# Patient Record
Sex: Male | Born: 1960
Health system: Southern US, Community
[De-identification: ages and names within clinical notes are randomized; demographics above are authoritative.]

## PROBLEM LIST (undated history)

## (undated) DIAGNOSIS — Z8709 Personal history of other diseases of the respiratory system: Secondary | ICD-10-CM

## (undated) DIAGNOSIS — F329 Major depressive disorder, single episode, unspecified: Secondary | ICD-10-CM

## (undated) DIAGNOSIS — F32A Depression, unspecified: Secondary | ICD-10-CM

## (undated) DIAGNOSIS — K6289 Other specified diseases of anus and rectum: Secondary | ICD-10-CM

## (undated) DIAGNOSIS — G8929 Other chronic pain: Secondary | ICD-10-CM

## (undated) DIAGNOSIS — K5790 Diverticulosis of intestine, part unspecified, without perforation or abscess without bleeding: Secondary | ICD-10-CM

## (undated) DIAGNOSIS — M549 Dorsalgia, unspecified: Secondary | ICD-10-CM

## (undated) DIAGNOSIS — Z8659 Personal history of other mental and behavioral disorders: Secondary | ICD-10-CM

## (undated) HISTORY — DX: Depression, unspecified: F32.A

## (undated) HISTORY — DX: Dorsalgia, unspecified: M54.9

## (undated) HISTORY — DX: Other chronic pain: G89.29

## (undated) HISTORY — PX: CHEST TUBE INSERTION: SHX231

## (undated) HISTORY — PX: INGUINAL HERNIA REPAIR: SUR1180

## (undated) HISTORY — DX: Major depressive disorder, single episode, unspecified: F32.9

## (undated) HISTORY — PX: CYSTECTOMY: SUR359

## (undated) HISTORY — DX: Diverticulosis of intestine, part unspecified, without perforation or abscess without bleeding: K57.90

## (undated) HISTORY — PX: SMALL INTESTINE SURGERY: SHX150

## (undated) HISTORY — DX: Personal history of other mental and behavioral disorders: Z86.59

## (undated) HISTORY — PX: OTHER SURGICAL HISTORY: SHX169

## (undated) HISTORY — DX: Personal history of other diseases of the respiratory system: Z87.09

## (undated) HISTORY — DX: Other specified diseases of anus and rectum: K62.89

---

## 2003-10-26 HISTORY — PX: CHOLECYSTECTOMY: SHX55

## 2004-10-25 DIAGNOSIS — K5790 Diverticulosis of intestine, part unspecified, without perforation or abscess without bleeding: Secondary | ICD-10-CM

## 2004-10-25 HISTORY — DX: Diverticulosis of intestine, part unspecified, without perforation or abscess without bleeding: K57.90

## 2004-10-25 HISTORY — PX: COLON SURGERY: SHX602

## 2011-06-15 ENCOUNTER — Inpatient Hospital Stay (HOSPITAL_COMMUNITY)
Admission: EM | Admit: 2011-06-15 | Discharge: 2011-06-16 | DRG: 301 | Disposition: A | Discharge status: Home / Self Care | Payer: Medicare Other | Attending: Family Medicine | Admitting: Family Medicine

## 2011-06-15 DIAGNOSIS — Z560 Unemployment, unspecified: Secondary | ICD-10-CM

## 2011-06-15 DIAGNOSIS — I824Z9 Acute embolism and thrombosis of unspecified deep veins of unspecified distal lower extremity: Principal | ICD-10-CM | POA: Diagnosis present

## 2011-06-15 DIAGNOSIS — K5730 Diverticulosis of large intestine without perforation or abscess without bleeding: Secondary | ICD-10-CM | POA: Diagnosis present

## 2011-06-15 DIAGNOSIS — M545 Low back pain, unspecified: Secondary | ICD-10-CM | POA: Diagnosis present

## 2011-06-15 DIAGNOSIS — G894 Chronic pain syndrome: Secondary | ICD-10-CM

## 2011-06-15 DIAGNOSIS — G8929 Other chronic pain: Secondary | ICD-10-CM | POA: Diagnosis present

## 2011-06-15 DIAGNOSIS — Z7901 Long term (current) use of anticoagulants: Secondary | ICD-10-CM

## 2011-06-15 LAB — CBC
HCT: 44.3 % (ref 39.0–52.0)
Hemoglobin: 15.6 g/dL (ref 13.0–17.0)
MCH: 31.5 pg (ref 26.0–34.0)
MCHC: 35.2 g/dL (ref 30.0–36.0)
MCV: 89.5 fL (ref 78.0–100.0)
RBC: 4.95 MIL/uL (ref 4.22–5.81)

## 2011-06-15 LAB — DIFFERENTIAL
Basophils Relative: 0 % (ref 0–1)
Lymphocytes Relative: 28 % (ref 12–46)
Lymphs Abs: 2.5 10*3/uL (ref 0.7–4.0)
Monocytes Absolute: 0.8 10*3/uL (ref 0.1–1.0)
Monocytes Relative: 9 % (ref 3–12)
Neutro Abs: 5.4 10*3/uL (ref 1.7–7.7)
Neutrophils Relative %: 60 % (ref 43–77)

## 2011-06-15 LAB — POCT I-STAT, CHEM 8
BUN: 20 mg/dL (ref 6–23)
Chloride: 102 mEq/L (ref 96–112)
Creatinine, Ser: 1.2 mg/dL (ref 0.50–1.35)
Sodium: 139 mEq/L (ref 135–145)
TCO2: 28 mmol/L (ref 0–100)

## 2011-06-15 LAB — PROTIME-INR: Prothrombin Time: 13.4 seconds (ref 11.6–15.2)

## 2011-06-16 DIAGNOSIS — I824Z9 Acute embolism and thrombosis of unspecified deep veins of unspecified distal lower extremity: Secondary | ICD-10-CM

## 2011-06-16 DIAGNOSIS — Z7901 Long term (current) use of anticoagulants: Secondary | ICD-10-CM

## 2011-06-16 LAB — COMPREHENSIVE METABOLIC PANEL
ALT: 26 U/L (ref 0–53)
Albumin: 3.6 g/dL (ref 3.5–5.2)
Alkaline Phosphatase: 65 U/L (ref 39–117)
BUN: 18 mg/dL (ref 6–23)
Chloride: 103 mEq/L (ref 96–112)
GFR calc Af Amer: >60 mL/min (ref >60)
Glucose, Bld: 100 mg/dL — ABNORMAL HIGH (ref 70–99)
Potassium: 4.1 mEq/L (ref 3.5–5.1)
Sodium: 137 mEq/L (ref 135–145)
Total Bilirubin: 0.5 mg/dL (ref 0.3–1.2)
Total Protein: 6.8 g/dL (ref 6.0–8.3)

## 2011-06-16 LAB — CBC
Hemoglobin: 14.9 g/dL (ref 13.0–17.0)
MCH: 31.6 pg (ref 26.0–34.0)
MCV: 89.2 fL (ref 78.0–100.0)
Platelets: 220 10*3/uL (ref 150–400)
RBC: 4.71 MIL/uL (ref 4.22–5.81)
WBC: 6.7 10*3/uL (ref 4.0–10.5)

## 2011-06-17 ENCOUNTER — Ambulatory Visit (INDEPENDENT_AMBULATORY_CARE_PROVIDER_SITE_OTHER): Payer: Medicare Other | Admitting: *Deleted

## 2011-06-17 DIAGNOSIS — Z7901 Long term (current) use of anticoagulants: Secondary | ICD-10-CM

## 2011-06-17 DIAGNOSIS — I824Z9 Acute embolism and thrombosis of unspecified deep veins of unspecified distal lower extremity: Secondary | ICD-10-CM

## 2011-06-18 ENCOUNTER — Ambulatory Visit (INDEPENDENT_AMBULATORY_CARE_PROVIDER_SITE_OTHER): Payer: Medicare Other | Admitting: *Deleted

## 2011-06-18 DIAGNOSIS — I824Z9 Acute embolism and thrombosis of unspecified deep veins of unspecified distal lower extremity: Secondary | ICD-10-CM

## 2011-06-18 DIAGNOSIS — Z7901 Long term (current) use of anticoagulants: Secondary | ICD-10-CM

## 2011-06-21 ENCOUNTER — Ambulatory Visit (INDEPENDENT_AMBULATORY_CARE_PROVIDER_SITE_OTHER): Payer: Medicare Other | Admitting: *Deleted

## 2011-06-21 DIAGNOSIS — Z7901 Long term (current) use of anticoagulants: Secondary | ICD-10-CM

## 2011-06-21 DIAGNOSIS — I824Z9 Acute embolism and thrombosis of unspecified deep veins of unspecified distal lower extremity: Secondary | ICD-10-CM

## 2011-06-23 ENCOUNTER — Ambulatory Visit (INDEPENDENT_AMBULATORY_CARE_PROVIDER_SITE_OTHER): Payer: Medicare Other | Admitting: *Deleted

## 2011-06-23 DIAGNOSIS — Z7901 Long term (current) use of anticoagulants: Secondary | ICD-10-CM

## 2011-06-23 DIAGNOSIS — I824Z9 Acute embolism and thrombosis of unspecified deep veins of unspecified distal lower extremity: Secondary | ICD-10-CM

## 2011-06-23 LAB — POCT INR: INR: 2.4

## 2011-06-24 ENCOUNTER — Ambulatory Visit (INDEPENDENT_AMBULATORY_CARE_PROVIDER_SITE_OTHER): Payer: Medicare Other | Admitting: *Deleted

## 2011-06-24 DIAGNOSIS — Z7901 Long term (current) use of anticoagulants: Secondary | ICD-10-CM

## 2011-06-24 DIAGNOSIS — I824Z9 Acute embolism and thrombosis of unspecified deep veins of unspecified distal lower extremity: Secondary | ICD-10-CM

## 2011-06-24 LAB — POCT INR: INR: 2.2

## 2011-06-25 ENCOUNTER — Ambulatory Visit: Payer: Medicare Other | Admitting: Family Medicine

## 2011-06-25 ENCOUNTER — Emergency Department (HOSPITAL_COMMUNITY): Payer: Medicare Other

## 2011-06-25 ENCOUNTER — Ambulatory Visit: Payer: Medicare Other

## 2011-06-25 ENCOUNTER — Observation Stay (HOSPITAL_COMMUNITY)
Admission: EM | Admit: 2011-06-25 | Discharge: 2011-06-26 | Disposition: A | Discharge status: Home / Self Care | Payer: Medicare Other | Attending: Family Medicine | Admitting: Family Medicine

## 2011-06-25 ENCOUNTER — Telehealth: Payer: Self-pay | Admitting: *Deleted

## 2011-06-25 ENCOUNTER — Encounter: Payer: Self-pay | Admitting: Family Medicine

## 2011-06-25 DIAGNOSIS — R0602 Shortness of breath: Secondary | ICD-10-CM | POA: Insufficient documentation

## 2011-06-25 DIAGNOSIS — R7402 Elevation of levels of lactic acid dehydrogenase (LDH): Secondary | ICD-10-CM | POA: Insufficient documentation

## 2011-06-25 DIAGNOSIS — R0789 Other chest pain: Principal | ICD-10-CM | POA: Insufficient documentation

## 2011-06-25 DIAGNOSIS — R51 Headache: Secondary | ICD-10-CM | POA: Insufficient documentation

## 2011-06-25 DIAGNOSIS — R7401 Elevation of levels of liver transaminase levels: Secondary | ICD-10-CM | POA: Insufficient documentation

## 2011-06-25 DIAGNOSIS — R42 Dizziness and giddiness: Secondary | ICD-10-CM | POA: Insufficient documentation

## 2011-06-25 DIAGNOSIS — Z7901 Long term (current) use of anticoagulants: Secondary | ICD-10-CM | POA: Insufficient documentation

## 2011-06-25 DIAGNOSIS — R61 Generalized hyperhidrosis: Secondary | ICD-10-CM | POA: Insufficient documentation

## 2011-06-25 DIAGNOSIS — E785 Hyperlipidemia, unspecified: Secondary | ICD-10-CM | POA: Insufficient documentation

## 2011-06-25 DIAGNOSIS — Z86718 Personal history of other venous thrombosis and embolism: Secondary | ICD-10-CM | POA: Insufficient documentation

## 2011-06-25 LAB — CBC
Hemoglobin: 16 g/dL (ref 13.0–17.0)
MCH: 31.2 pg (ref 26.0–34.0)
Platelets: 253 10*3/uL (ref 150–400)
Platelets: 235 10*3/uL (ref 150–400)
RBC: 5.04 MIL/uL (ref 4.22–5.81)
RBC: 4.94 MIL/uL (ref 4.22–5.81)
WBC: 6.8 10*3/uL (ref 4.0–10.5)
WBC: 6.7 10*3/uL (ref 4.0–10.5)

## 2011-06-25 LAB — BASIC METABOLIC PANEL
Chloride: 101 mEq/L (ref 96–112)
GFR calc Af Amer: >60 mL/min (ref >60)
GFR calc non Af Amer: >60 mL/min (ref >60)
Potassium: 3.8 mEq/L (ref 3.5–5.1)
Sodium: 136 mEq/L (ref 135–145)

## 2011-06-25 LAB — TROPONIN I: Troponin I: <0.3 ng/mL (ref <0.30)

## 2011-06-25 LAB — POCT I-STAT TROPONIN I

## 2011-06-25 LAB — DIFFERENTIAL
Basophils Relative: 0 % (ref 0–1)
Eosinophils Absolute: 0.1 10*3/uL (ref 0.0–0.7)
Neutro Abs: 3.3 10*3/uL (ref 1.7–7.7)
Neutrophils Relative %: 48 % (ref 43–77)

## 2011-06-25 LAB — PROTIME-INR
INR: 1.72 — ABNORMAL HIGH (ref 0.00–1.49)
Prothrombin Time: 20.5 seconds — ABNORMAL HIGH (ref 11.6–15.2)

## 2011-06-25 LAB — PRO B NATRIURETIC PEPTIDE: Pro B Natriuretic peptide (BNP): 98.8 pg/mL (ref 0–125)

## 2011-06-25 MED ORDER — IOHEXOL 300 MG/ML  SOLN
100.0000 mL | Freq: Once | INTRAMUSCULAR | Status: AC | PRN
  Administered 2011-06-25: 100 mL via INTRAVENOUS

## 2011-06-25 NOTE — H&P (Signed)
Family Medicine Teaching [facility] Admission History and Physical  Patient name: Zachary [name] Medical record number: [id number] Date of birth: 1961-09-29 Age: 50 y.o. Gender: male  Primary Care Provider: Shelly Flatten, MD, MD  Chief Complaint: Chest Pain History of Present Illness: Zachary [name] is a 50 y.o. year old male who was recently hospitalized with DVT of the R leg and now is presenting with chest pain.  Patient states that he began having chest pain last night.  Patient was not doing anything when pain came on and he describes it as substernal pain and pressure.  He did have diaphoresis associated with this as well as some mild lightheadedness.  He then had another episode of the same type of pain this morning that woke him from his sleep.  He denies shortness of breath, palpitations, nausea, or radiation of pain.  Pain is not worsened by activity and does not change with rest.  He denies chest trauma, or any strenuous work or cough recently.  The only thing that makes the pain worse is deep inspiration.    In the ED he received a CTA which was negative for pulmonary embolism.  He was placed in the CDU to be ruled out on their chest pain protocol, but the PA soon realized that this is not done on the weekend we were asked to admit.   Patient Active Problem List  Diagnoses  . Encounter for long-term (current) use of anticoagulants  . Deep vein thrombosis of lower leg   Past Medical History: Past Medical History  Diagnosis Date  . Right leg DVT august 2012  . Chronic back pain   . History of depression   . Diverticulosis   . History of pneumothorax     Past Surgical History: Past Surgical History  Procedure Date  . Inguinal hernia repair     x2  . Spinal steroid injections   . Chest tube insertion     for pneumothorax  . Cholecystectomy     Social History: History   Social History  . Marital Status: Married    Spouse Name: N/A    Number of Children:  N/A  . Years of Education: N/A   Social History Main Topics  . Smoking status: Not on file  . Smokeless tobacco: Not on file  . Alcohol Use: Not on file  . Drug Use: Not on file  . Sexually Active: Not on file   Other Topics Concern  . Not on file   Social History Narrative   Married with 3 children ages 82, 46 and 75.  Originally from [place].  On disability 2/2 to his chronic back pain. Denies tobacco use.  Drinks 2-3 glasses of wine per day.  Denies illicit drugs.    Family History: Family History  Problem Relation Age of Onset  . Stroke Father   . Heart attack Mother     Allergies: No Known Allergies   Current Outpatient Prescriptions on File Prior to Visit  Medication Sig Dispense Refill  . warfarin (COUMADIN) 5 MG tablet Take 5 mg by mouth daily.        -Saw Palmetto -Fish oil -Tums prn  Review Of Systems: Per HPI   Physical Exam: Pulse: 61  Blood Pressure: 103/64 RR: 18   O2: 96% on RA Temp: 96.8  General: alert, cooperative and no distress HEENT: PERRLA, extra ocular movement intact, sclera clear, anicteric and oropharynx clear, no lesions Heart: S1, S2 normal, no murmur, rub or gallop,  regular rate and rhythm.  Palpation of lower left ribs recreates his chest pain. Lungs: clear to auscultation, no wheezes or rales and unlabored breathing Abdomen: abdomen is soft without significant tenderness, masses, organomegaly or guarding Extremities: extremities normal, atraumatic, no cyanosis or edema Skin:no rashes Neurology: normal without focal findings and mental status, speech normal, alert and oriented x3  Labs and Imaging: Lab Results  Component Value Date/Time   NA 136 06/25/2011 12:08 PM   K 3.8 06/25/2011 12:08 PM   CL 101 06/25/2011 12:08 PM   CO2 26 06/25/2011 12:08 PM   BUN 20 06/25/2011 12:08 PM   CREATININE 0.96 06/25/2011 12:08 PM   GLUCOSE 101* 06/25/2011 12:08 PM   Lab Results  Component Value Date   WBC 6.7 06/25/2011   HGB 15.4  06/25/2011   HCT 43.4 06/25/2011   MCV 87.9 06/25/2011   PLT 235 06/25/2011   CXR:  Negative CTA:  Negative for pulmonary embolism  INR: 1.72 BNP: 98.8 POC Trop: Negative EKG: NSR, no ST-T wave changes.  Assessment and Plan: Zachary [name] is a 50 y.o. year old male presenting with chest pain 1. Chest pain:  Given recent DVT, initial consideration was given to pulmonary embolism, although his O2 sats are normal and CTA does not show pulmonary embolism.  Chest pain is very atypical in nature, but given that it did wake him from sleep and he experienced diaphoresis with this will admit and rule out for MI.  Patient does not have any known risk factors.  EKG normal.  Will repeat in the am.  Will also cycle cardiac enzymes and risk stratify with HgbA1C, FLP, and TSH.  If her rules out, likely cause is musculoskeletal given reproducible nature with palpation. 2. DVT:  Currently on coumadin for recent DVT.  Has been having INR followed at [facility].  Currently sub-therapeutic at 1.72.  Will continue coumadin here per pharmacy and continue INR checks outpatient once discharged. 2. FEN/GI: SLIV, Low sodium heart healthy 3. Prophylaxis: Coumadin 4. Disposition: Pending rule out for MI

## 2011-06-25 NOTE — Telephone Encounter (Signed)
Call from patient stating he has had chest pain for 3 days. Last night had episode of sweating with the pain. This AM has dizziness , chest aching and nausea. Advised patient to go to ED now. Advised to call EMS to transport now. He has appointment with Dr. Hulen Luster this afternoon . Told him not to wait, but to go to ED now. He voices understanding

## 2011-06-26 ENCOUNTER — Encounter: Payer: Self-pay | Admitting: Family Medicine

## 2011-06-26 DIAGNOSIS — R0789 Other chest pain: Secondary | ICD-10-CM

## 2011-06-26 LAB — COMPREHENSIVE METABOLIC PANEL
AST: 104 U/L — ABNORMAL HIGH (ref 0–37)
Alkaline Phosphatase: 64 U/L (ref 39–117)
BUN: 21 mg/dL (ref 6–23)
CO2: 24 mEq/L (ref 19–32)
Chloride: 103 mEq/L (ref 96–112)
Creatinine, Ser: 1.12 mg/dL (ref 0.50–1.35)
GFR calc non Af Amer: >60 mL/min (ref >60)
Potassium: 4.1 mEq/L (ref 3.5–5.1)
Total Bilirubin: 0.2 mg/dL — ABNORMAL LOW (ref 0.3–1.2)

## 2011-06-26 LAB — HEMOGLOBIN A1C
Hgb A1c MFr Bld: 5.7 % — ABNORMAL HIGH (ref <5.7)
Mean Plasma Glucose: 117 mg/dL — ABNORMAL HIGH (ref <117)

## 2011-06-26 LAB — CBC
HCT: 43.4 % (ref 39.0–52.0)
MCH: 31 pg (ref 26.0–34.0)
MCV: 88 fL (ref 78.0–100.0)
Platelets: 243 10*3/uL (ref 150–400)
RDW: 12.7 % (ref 11.5–15.5)

## 2011-06-26 LAB — LIPID PANEL
Cholesterol: 239 mg/dL — ABNORMAL HIGH (ref 0–200)
HDL: 33 mg/dL — ABNORMAL LOW (ref >39)
Total CHOL/HDL Ratio: 7.2 RATIO

## 2011-06-26 LAB — CARDIAC PANEL(CRET KIN+CKTOT+MB+TROPI)
Relative Index: INVALID (ref 0.0–2.5)
Relative Index: INVALID (ref 0.0–2.5)
Troponin I: <0.3 ng/mL (ref <0.30)

## 2011-06-26 LAB — PROTIME-INR: INR: 2.04 — ABNORMAL HIGH (ref 0.00–1.49)

## 2011-06-29 ENCOUNTER — Ambulatory Visit (INDEPENDENT_AMBULATORY_CARE_PROVIDER_SITE_OTHER): Payer: Medicare Other | Admitting: *Deleted

## 2011-06-29 DIAGNOSIS — I824Z9 Acute embolism and thrombosis of unspecified deep veins of unspecified distal lower extremity: Secondary | ICD-10-CM

## 2011-06-29 DIAGNOSIS — Z7901 Long term (current) use of anticoagulants: Secondary | ICD-10-CM

## 2011-06-29 LAB — POCT INR: INR: 2.8

## 2011-06-30 NOTE — H&P (Signed)
[name], [name]           ACCOUNT NO.:  [id number]  MEDICAL RECORD NO.:  [id number]  LOCATION:  2018                         FACILITY:  [facility]  PHYSICIAN:  Zachary Roach Markeis Allman, M.D.DATE OF BIRTH:  Mar 11, 1961  DATE OF ADMISSION:  06/15/2011 DATE OF DISCHARGE:                             HISTORY & PHYSICAL   PRIMARY CARE PHYSICIAN:  Unassigned.  CHIEF COMPLAINT:  Right calf pain.  HISTORY OF PRESENT ILLNESS:  The patient is a 50 year old male originally from [place], but recently from [place] who presented with right calf cramping and pain for 2 days.  The patient states the pain is being worsening and that is aggravated with certain positions, was walking and was bearing weight.  He has not tried anything for the pain.  Pain was so severe today that the patient went to urgent care where they sent him to [facility] for an ultrasound.  He is found to have a DVT and told to come to the ER.  The patient has not had any recent travel, but has been spending hours at a time at fle[employer] markets. He also has some immobility secondary to chronic back pain.  No previous history of DVTs.  The patient denies shortness of breath.  REVIEW OF SYSTEMS:  Chest pain described as sharp, questionably the patient has a language barrier and found hard to find right word.  Chest pain was not associated with exertion.  Otherwise, negative other than HPI.  ED COURSE:  CBC and BMET within normal limits.  The patient given Percocet 5/325 mg for pain, Lovenox 90 mg subcu and Coumadin 10 mg p.o.  ALLERGIES:  No known drug allergies.  MEDICATIONS: 1. Saw Palmetto. 2. Calcium carbonate. 3. Fish oil.  PAST MEDICAL HISTORY: 1. Chronic back pain. 2. Inguinal hernias. 3. Cholecystitis. 4. Diverticulosis. 5. Questionable history of depression.  HOSPITALIZATION/SURGERIES:  In 2002, bilateral hernia repair with mesh; 2005, cholecystectomy; 2006, hospitalized for diverticulitis.  FAMILY  HISTORY:  Parents are deceased.  Father deceased from CVA. Mother deceased from MI.  SOCIAL HISTORY:  The patient is married and has 3 children ages 5, 67, and 67 years old.  He does not work due to his chronic back pain and is on disability.  Tobacco use never.  Alcohol, 2-3 glasses of wine per day.  Drugs none.  PHYSICAL EXAMINATION:  VITAL SIGNS:  Temperature 98.3, pulse 56, respiratory rate 18, blood pressure 100/63, oxygen 100% on room air. GENERAL:  Pleasant, talkative, in no apparent distress. CARDIOVASCULAR:  Regular rate and rhythm.  No murmurs, rubs, or gallops, 2+ radial pulses. PULMONARY:  Clear to auscultation bilaterally.  No wheezing, rales, or rhonchi. EXTREMITIES:  Lower extremity, no obvious swelling.  Right leg is colder to touch than the left leg.  Right leg, moderately tender to palpation along anterior tibial plateau and lower popliteal fossa.  A 2+ DP and popliteal pulses bilaterally.  ASSESSMENT AND PLAN:  A 50 year old male with chronic back pain who presents with right lower extremity DVT. 1. Deep vein thrombosis.  The patient presented with pain and     decreased temperature in right leg and has some risk factors for     DVT namely being immobility secondary  to back pain and recent long     hours being [place] [employer].  DVT was diagnosed with an ultrasound     at [facility].  We will treat Lovenox 90 mg subcu and bridge the     patient with Coumadin to goal INR of 2-3.  The patient will need     anticoagulation with Coumadin at least 3-6 months to prevent recurrence.     He will also need to follow up with some one regarding his INR     check.  We happily do that for this patient and we will decide that     before discharge.  Cause of this patient's DVT is likely his     decreased immobility.  Workup for other causes is not necessary at     this time. 2. Chronic back pain.  The patient states that he has had "issues with     his back for years."  The  patient does not take any medications for     his back pain and tries to just deal with it.  He has received one     cortical steroid injection in the past.  The patient should have     outpatient follow up for this.  One thing to consider is if his     chronic back pain leading to chronic immobility, we will set him up     to have chronic risk factor for DVT in future. 3. Fluids, electrolytes, nutrition/gastrointestinal.  Saline lock IV. 4. Disposition.  Having improvement of right leg pain and instruction     on how to inject Lovenox.  He is a candidate for outpatient DVT     treatment with Lovenox and Coumadin barring developing shortness of     breath or renal impairment.  He will be set up with [facility] for monitoring of his INR.    ______________________________ Zachary Coombe, MD   ______________________________ Zachary [name], M.D.    CM/MEDQ  D:  06/15/2011  T:  06/16/2011  Job:  [id number]  Electronically Signed by Zachary Coombe MD on 06/17/2011 03:11:41 PM Electronically Signed by Zachary [name] M.D. on 06/30/2011 08:08:20 AM

## 2011-06-30 NOTE — Discharge Summary (Signed)
  NAMESAYED, APOSTOL           ACCOUNT NO.:  [id number]  MEDICAL RECORD NO.:  [id number]  LOCATION:  2018                         FACILITY:  [facility]  PHYSICIAN:  Leighton Roach Knoxx Boeding, M.D.DATE OF BIRTH:  10-16-1961  DATE OF ADMISSION:  06/15/2011 DATE OF DISCHARGE:  06/16/2011                              DISCHARGE SUMMARY   PRIMARY CARE PROVIDER:  Shelly Flatten, MD  REASON FOR ADMISSION:  Right leg DVT.  DISCHARGE DIAGNOSIS:  Primary right leg DVT.  DISCHARGE MEDICATIONS: 1. Lovenox 100 mg per mL injections 90 mg subcu b.i.d. 2. Percocet 5/325 one to two tablets by mouth every 6 hours as needed     for p.r.n. pain. 3. Warfarin 5 mg 2 tablets by mouth daily with followup and changing     of dose per [facility]. 4. Calcium bicarb vitamin 1 tablet by mouth twice daily. 5. Fish oil 1 capsule by mouth twice daily.  HOSPITAL COURSE:    DVT: Mr. Ferrall was admitted to our service after being sent to the ED from [facility] with an ultrasound confirmed diagnosis of right lower extremity DVT.  The patient had experienced right leg pain prior to admission without shortness of breath, chest pain, or tachycardia. The patient has a history of chronic lower back pain that limits his activity, but he does not lead a sedentary lifestyle.  The patient's risk factors were closely examined due to this being felt as a possible unprovoked DVT.  The patient was started on Lovenox and warfarin 10 mg. The patient did well overnight with some decrease in pain by the following morning.  The patient was discharged on June 16, 2011, after receiving education on PEs, DVTs, warfarin, and Lovenox injections; and with close daily followup scheduled for INR checks and a hospital followup.  PATIENT CONDITION AT THE TIME OF DISCHARGE:  Good.  DISPOSITION:  Home.  [facility], 24, and 27, 2012, with further appointments  to be determined at a hospital followup on July 02, 2011, with Dr. Shelly Flatten.  FOLLOWUP ISSUES:  INR checks and plus/minus unprovoked DVT.    ______________________________ Shelly Flatten, MD   ______________________________ Leighton Roach Lasheba Stevens, M.D.    DM/MEDQ  D:  06/16/2011  T:  06/17/2011  Job:  [id number]  Electronically Signed by Shelly Flatten MD on 06/22/2011 08:57:22 PM Electronically Signed by Acquanetta Belling M.D. on 06/30/2011 08:08:14 AM

## 2011-07-02 ENCOUNTER — Inpatient Hospital Stay: Payer: Medicare Other | Admitting: Family Medicine

## 2011-07-02 ENCOUNTER — Ambulatory Visit (INDEPENDENT_AMBULATORY_CARE_PROVIDER_SITE_OTHER): Payer: Medicare Other | Admitting: *Deleted

## 2011-07-02 ENCOUNTER — Ambulatory Visit (INDEPENDENT_AMBULATORY_CARE_PROVIDER_SITE_OTHER): Payer: Medicare Other | Admitting: Family Medicine

## 2011-07-02 VITALS — BP 117/78 | HR 71 | Wt 200.0 lb

## 2011-07-02 DIAGNOSIS — E785 Hyperlipidemia, unspecified: Secondary | ICD-10-CM

## 2011-07-02 DIAGNOSIS — I824Z9 Acute embolism and thrombosis of unspecified deep veins of unspecified distal lower extremity: Secondary | ICD-10-CM

## 2011-07-02 DIAGNOSIS — R748 Abnormal levels of other serum enzymes: Secondary | ICD-10-CM | POA: Insufficient documentation

## 2011-07-02 DIAGNOSIS — R7402 Elevation of levels of lactic acid dehydrogenase (LDH): Secondary | ICD-10-CM

## 2011-07-02 DIAGNOSIS — R7401 Elevation of levels of liver transaminase levels: Secondary | ICD-10-CM

## 2011-07-02 DIAGNOSIS — Z7901 Long term (current) use of anticoagulants: Secondary | ICD-10-CM

## 2011-07-02 MED ORDER — WARFARIN SODIUM 5 MG PO TABS: 5.0000 mg | ORAL_TABLET | Freq: Every day | ORAL | Status: DC

## 2011-07-02 NOTE — Patient Instructions (Signed)
Thank you for coming in today. We will continue your warfarin please come back on Thursday for recheck. We will recheck her cholesterol and your liver labs when he see Dr. Margot Ables in 2-3 weeks. Please schedule an appointment with her regular doctor for that date. You can continue all the supplements that were reviewed.  Take care and we will see you soon.

## 2011-07-02 NOTE — Progress Notes (Signed)
Zachary [name] presents to clinic today to followup his recent hospitalization.  He's been in the hospital twice for the last month.  First for a DVT of the right lower extremity and second for a chest pain episode.  He currently feels well and denies any current chest pain. He will occasionally have a sharp nonradiating pain in his left chest upon deep inspiration still, however it's much improved.  He denies any crushing pain or pain that is worse with exertion.  He also denies dyspnea on exertion.  He had several issues that were important to followup on this hospitalization.  #1: Anticoagulation currently taking warfarin 5 mg alternating with 7.5 mg every day. His INR trend is  Lab Results  Component Value Date   INR 2.6 07/02/2011   INR 2.8 06/29/2011   INR 2.04* 06/26/2011   he is no longer taking Lovenox  #2: Elevated liver enzymes: His AST was in 200s and his ALT was 100.  He denies any upper right quadrant pain jaundice or fatty stool. He denies any excessive alcohol intake and doesn't know if he has hepatitis.  #3: Elevated cholesterol: He had a lipid panel obtained in the hospitalization which showed total cholesterol into 230s and triglycerides of 400s and unable to calculate LDL.  He currently takes omega-3 fatty acid 2000 mg a day. He in the past he was on Lipitor but had elevated liver enzymes on this medication and was discontinued.    PMH reviewed.  ROS as above otherwise neg Medications were reviewed  Exam:  BP 117/78  Pulse 71  Wt 200 lb (90.719 kg) Gen: Well NAD HEENT: EOMI,  MMM Lungs: CTABL Nl WOB Heart: RRR no MRG Abd: NABS, NT, ND Exts: Non edematous BL  LE, warm and well perfused.

## 2011-07-02 NOTE — Assessment & Plan Note (Signed)
Lab Results  Component Value Date   CHOL 239* 06/26/2011   Lab Results  Component Value Date   HDL 33* 06/26/2011   Lab Results  Component Value Date   LDLCALC UNABLE TO CALCULATE IF TRIGLYCERIDE OVER 400 mg/dL 01/29/9628   Lab Results  Component Value Date   TRIG 463* 06/26/2011   Lab Results  Component Value Date   CHOLHDL 7.2 06/26/2011   No results found for this basename: LDLDIRECT    Lipids noted above.  I'm not sure if this was a truly fasting lab is elevated triglycerides are unlikely to be that high fasting. Plan to repeat this lab checked with his next PCP visit while truly fasting.  If continues to be elevated would recommend obtaining a direct LDL and starting patient on a low potency statin such as pravastatin.  I also recommended increasing his omega-3 fatty acid to a therapeutic dose for hypertriglyceridemia.  He will followup with his PCP for this issue.

## 2011-07-02 NOTE — Assessment & Plan Note (Signed)
Doing well currently his INR is at goal. Plan to continue 5 alternating with 7.5.  We'll followup with a repeat INR checked on Thursday. Patient expresses understanding

## 2011-07-02 NOTE — Assessment & Plan Note (Signed)
Lab Results  Component Value Date   ALT 237* 06/26/2011   AST 104* 06/26/2011   ALKPHOS 64 06/26/2011   BILITOT 0.2* 06/26/2011   I am not sure why he has elevated liver enzymes.  Could be due to fatty infiltrate versus chronic hepatitis versus some other factors such as hemochromatosis.   Plan to repeat this lab check when he follows up with his PCP in 2-3 weeks.  If still elevated I recommend pursuing workup for the above etiologies with hepatitis panel and possibly abdominal ultrasound and possible hemachromatosis workup.

## 2011-07-08 ENCOUNTER — Ambulatory Visit (INDEPENDENT_AMBULATORY_CARE_PROVIDER_SITE_OTHER): Payer: Medicare Other | Admitting: *Deleted

## 2011-07-08 DIAGNOSIS — I824Z9 Acute embolism and thrombosis of unspecified deep veins of unspecified distal lower extremity: Secondary | ICD-10-CM

## 2011-07-08 DIAGNOSIS — Z7901 Long term (current) use of anticoagulants: Secondary | ICD-10-CM

## 2011-07-15 ENCOUNTER — Ambulatory Visit (INDEPENDENT_AMBULATORY_CARE_PROVIDER_SITE_OTHER): Payer: Medicare Other | Admitting: *Deleted

## 2011-07-15 DIAGNOSIS — I824Z9 Acute embolism and thrombosis of unspecified deep veins of unspecified distal lower extremity: Secondary | ICD-10-CM

## 2011-07-15 DIAGNOSIS — Z7901 Long term (current) use of anticoagulants: Secondary | ICD-10-CM

## 2011-07-15 LAB — POCT INR: INR: 1.6

## 2011-07-20 NOTE — Discharge Summary (Signed)
NAMEELIASAR, HLAVATY NO.:  [id number]  MEDICAL RECORD NO.:  [id number]  LOCATION:  2021                         FACILITY:  [facility]  PHYSICIAN:  Pearlean Brownie, M.D.DATE OF BIRTH:  [birth year]  DATE OF ADMISSION:  06/25/2011 DATE OF DISCHARGE:  06/26/2011                              DISCHARGE SUMMARY   DISCHARGE DIAGNOSES: 1. Chest pain, noncardiac. 2. Hyperlipidemia. 3. Deep venous thrombosis. 4. Transaminitis.  DISCHARGE MEDICATIONS: 1. Calcium carbonate/vitamin D supplement one tablet p.o. b.i.d. 2. Fish oil over-the-counter one tablet p.o. b.i.d. 3. Warfarin 5 mg one tablet p.o. Mondays and Fridays, Saturdays and     Sundays and 1.5 tablets Mondays, Tuesdays, Wednesdays, Thursdays.  PERTINENT LAB VALUES:  On June 25, 2011; CBC with differential was unremarkable.  On June 25, 2011; cardiac panel was unremarkable at 12:30 p.m., a second panel at 1900 was also unremarkable, third panel on June 26, 2011 at 3:46 a.m. unremarkable, and a fourth panel on June 26, 2011 at 9 a.m. also unremarkable.  On June 26, 2011; lipid profile; total cholesterol 239, triglycerides 463, HDL 33, LDL unable to calculate due to elevated triglycerides, this is not a fasting panel.  On June 26, 2011; complete metabolic panel; sodium 138, potassium 4.1, chloride 103, CO2 24, glucose 99, BUN 21, creatinine 1.12.  Total bilirubin 0.2, alk phos 63, AST 104, ALT 237, total protein 6.8, albumin 3.7, calcium 9.1.  TSH was 3.323.  RADIOLOGY:  On June 25, 2011; chest x-ray two-view showed no evidence of acute cardiopulmonary disease and a CT angiogram of the chest showed no evidence of pulmonary embolus and no acute finding.  BRIEF HOSPITAL COURSE:  Mr. Cordell is a 50 year old man recently admitted to the [facility] Medicine Service with DVT.  He presented back to the hospital on June 25, 2011 with chest pain, it was atypical pain, but he was admitted for  rule out chest pain. 1. Rule out acute coronary syndrome.  The patient had four sets of     cardiac enzymes that were negative.  His EKG was within normal     limits.  The patient also had a CTA of the chest showing no     pulmonary embolus. 2. Deep venous thrombosis.  The patient has been continued on the     warfarin he was started on for his prior deep venous thrombosis.     He was therapeutic, INR above 2 prior to discharge. 3. Hyperlipidemia.  The patient found to have elevated cholesterol in     the hospital; however, the panel obtained was not a fasting lipid     panel; therefore, obscured.  We would recommend that the patient     follow up at [facility] with Dr. Margot Ables next week     and obtain a fasting panel to follow up. 4. Elevated LFTs.  The patient had mildly elevated liver function     tests.  The patient does report problems with liver in the past     when he took a statin; however, he has not had this recently.  He     denies being a alcohol drinker or any exposure to hepatitis.  He do  not currently have an abdominal pain and we will ask Dr. Margot Ables to     follow up the patient's liver function as an outpatient.  FOLLOWUP ISSUES AND RECOMMENDATIONS:  The patient is to call on Tuesday when office opens to schedule an appointment with Dr. Margot Ables if possible.  We would ask the physician seeing him to follow up his chest pain, his INR, his hyperlipidemia and his liver function tests.  The patient was discharged home in stable medical condition.    ______________________________ Ardyth Gal, MD   ______________________________ Pearlean Brownie, M.D.    CR/MEDQ  D:  06/26/2011  T:  06/26/2011  Job:  [id number]  Electronically Signed by Ardyth Gal MD on 07/12/2011 09:05:37 AM Electronically Signed by Pearlean Brownie M.D. on 07/20/2011 11:06:44 AM

## 2011-07-20 NOTE — H&P (Signed)
Zachary [name] NO.:  [id number]  MEDICAL RECORD NO.:  [id number]  LOCATION:  2021                         FACILITY:  [facility]  PHYSICIAN:  Pearlean Brownie, M.D.DATE OF BIRTH:  06/19/1961  DATE OF ADMISSION:  06/25/2011 DATE OF DISCHARGE:                             HISTORY & PHYSICAL   PRIMARY CARE PROVIDER:  Shelly Flatten, MD at the [facility].  CHIEF COMPLAINT:  Chest pain.  HISTORY OF PRESENT ILLNESS:  Zachary [name] is a 50 year old male who was recently hospitalized with DVT of the right leg and is now presenting with chest pain.  The patient states that he began to having chest pain last night.  The patient was not doing anything when the pain came on and he described it as substernal pain and pressure.  He did have diaphoresis associated with this as well and some mild lightheadedness.  He then had an episode at the same type this morning that woke him from the sleep.  He denies shortness of breath, palpitations, nausea or radiation of this pain.  Pain is not worsened by activity and does not change with rest.  He denies chest trauma or any strenuous work or cough recently.  The only thing that makes the pain worse is deep inspiration.  In the ED, he received a CTA, which was negative for pulmonary embolism.  He was placed in the CDU to be ruled out on the chest pain protocol, but the PA in the ED soon realized that this is not over the weekend.  We were asked to admit for chest pain, ruled out.  PAST MEDICAL HISTORY: 1. Right leg DVT diagnosed in August 2012. 2. Chronic back pain. 3. History of depression. 4. Diverticulosis. 5. History of pneumothorax.  PAST SURGICAL HISTORY: 1. Inguinal hernia repair x2. 2. Spinal steroid injections. 3. Chest tube insertion for pneumothorax. 4. Cholecystectomy.  SOCIAL HISTORY:  He is married with three children ages 48, 104, and 17. He is originally from [place].  He is currently on disability secondary to his chronic back pain.  He denies tobacco use. He drinks 2-3 glasses of wine per day.  He denies illicit drug use.  FAMILY HISTORY:  Father with stroke and mother with [name] attack.  ALLERGIES:  No known drug allergies.  CURRENT MEDICATIONS: 1. Coumadin 5 mg p.o. daily. 2. Saw palmetto. 3. Fish oil. 4. Tums p.r.n.  REVIEW OF SYSTEMS:  Per HPI.  PHYSICAL EXAMINATION:  VITAL SIGNS:  [name] rate 61, blood pressure 103/64, respiration rate 18, O2 saturation 96% on room air, and temperature 96.8. GENERAL:  Alert, cooperative and in no distress. HEENT:  Pupils are equal, round, and reactive to light.  Extraocular movements are intact.  Sclerae are clear and anicteric with clear oropharynx and without lesions. [name]:  S1-S2 normal.  No murmur, rub, or gallop.  He has regular rate and rhythm.  Palpation on the lower ribs recreates his chest pain. LUNGS:  Clear to auscultation.  No wheezes or rales and unlabored breathing. ABDOMEN:  Soft without significant tenderness, masses, organomegaly, or guarding. EXTREMITIES:  Normal, atraumatic, without cyanosis, or edema. SKIN:  Without rashes. NEUROLOGY:  Normal without focal  findings.  Mental status; speech normal.  Alert and oriented x3.  LABS AND IMAGING:  Sodium 136, potassium 3.8, chloride 101, bicarb 26, BUN 20, creatinine 0.96, glucose 101.  WBCs 6.7, hemoglobin 15.4, hematocrit 43.4, and platelets 235.  Chest x-ray was negative.  CTA was negative for pulmonary embolism.  INR was 1.72.  BNP was 98.8.  Point-of- care troponins were negative.  EKG with normal sinus rhythm with no ST or T wave changes.  ASSESSMENT AND PLAN:  Zachary [name] is a 50 year old male presenting with chest pain. 1. Chest pain.  Given recent deep venous thrombosis, initial     consideration was given to pulmonary embolism, although his oxygen     saturation is normal and CT does not show pulmonary  embolism.     Chest pain is very atypical in nature, but given that it wake him     sleep and he experienced diaphoresis, with these, we will admit to     rule out an myocardial infarction.  The patient has not had any     known risk factors.  EKG was normal.  We will repeat this in the     morning.  We will also cycle cardiac enzymes and risk stratify of     hemoglobin A1c, fasting lipid panel, and TSH.  If he rules out,     likely causes of his chest pain is musculoskeletal given the     reproducible nature with palpation. 2. Deep venous thrombosis, currently on Coumadin for recent deep     venous thrombosis.  Has been having INR followed at the [facility], currently subtherapeutic at 1.72.  We will     continue Coumadin here per pharmacy and continue INR checks     outpatient once discharge. 3. Fluids, electrolytes, nutrition/gastrointestinal.  Saline lock IV.     Low-sodium, [name]-healthy diet. 4. Deep venous thrombosis prophylaxis, Coumadin. 5. Disposition.  Pending to rule out for myocardial infarction.   ______________________________ Everrett Coombe, MD   ______________________________ Pearlean Brownie, M.D.   CM/MEDQ  D:  06/26/2011  T:  06/26/2011  Job:  [id number]  Electronically Signed by Everrett Coombe MD on 07/13/2011 03:13:30 PM Electronically Signed by Pearlean Brownie M.D. on 07/20/2011 11:06:50 AM

## 2011-07-22 ENCOUNTER — Ambulatory Visit (INDEPENDENT_AMBULATORY_CARE_PROVIDER_SITE_OTHER): Payer: Medicare Other | Admitting: *Deleted

## 2011-07-22 DIAGNOSIS — I824Z9 Acute embolism and thrombosis of unspecified deep veins of unspecified distal lower extremity: Secondary | ICD-10-CM

## 2011-07-22 DIAGNOSIS — Z7901 Long term (current) use of anticoagulants: Secondary | ICD-10-CM

## 2011-07-26 ENCOUNTER — Encounter: Payer: Self-pay | Admitting: Family Medicine

## 2011-07-26 ENCOUNTER — Ambulatory Visit (INDEPENDENT_AMBULATORY_CARE_PROVIDER_SITE_OTHER): Payer: Medicare Other | Admitting: *Deleted

## 2011-07-26 ENCOUNTER — Ambulatory Visit (INDEPENDENT_AMBULATORY_CARE_PROVIDER_SITE_OTHER): Payer: Medicare Other | Admitting: Family Medicine

## 2011-07-26 VITALS — BP 108/67 | HR 65 | Temp 98.2°F | Wt 201.5 lb

## 2011-07-26 DIAGNOSIS — R0789 Other chest pain: Secondary | ICD-10-CM

## 2011-07-26 DIAGNOSIS — R748 Abnormal levels of other serum enzymes: Secondary | ICD-10-CM

## 2011-07-26 DIAGNOSIS — R7401 Elevation of levels of liver transaminase levels: Secondary | ICD-10-CM

## 2011-07-26 DIAGNOSIS — R7402 Elevation of levels of lactic acid dehydrogenase (LDH): Secondary | ICD-10-CM

## 2011-07-26 DIAGNOSIS — Z7901 Long term (current) use of anticoagulants: Secondary | ICD-10-CM

## 2011-07-26 DIAGNOSIS — I824Z9 Acute embolism and thrombosis of unspecified deep veins of unspecified distal lower extremity: Secondary | ICD-10-CM

## 2011-07-26 DIAGNOSIS — E785 Hyperlipidemia, unspecified: Secondary | ICD-10-CM

## 2011-07-26 LAB — POCT INR: INR: 1.9

## 2011-07-26 MED ORDER — WARFARIN SODIUM 5 MG PO TABS: 5.0000 mg | ORAL_TABLET | Freq: Every day | ORAL | Status: DC

## 2011-07-26 NOTE — Progress Notes (Signed)
  Subjective:    Patient ID: Zachary [name], male    DOB: December 12, 1960, 50 y.o.   MRN: [id number]  HPI Hyperlipidemia: High fat diet at home consisting of a lot of fried foods. Pt placed on lipitor and crestor in the past by physician in [place], but had to stop due to increasing liver enzymes.   CP: continues to have L lower CP that is superficial in nature. Recent hospitalization and outpt workup suggestive of musculoskeletal pain. H/o previous L pneumothorax in 1983. No SOB, no rash or h/o chickenpox.  DVT (DX on 06/16/11): no LE pain or swelling. INR 1.9 today. Pt educated at previous visits about foods to avoid that may interact w/ INR, and is mostly compliant. Denies tobacco use.   Elevated Liver enzymes: Unknown etiology. Has not had alcohol in several months and no h/o hepatitis exposure. Long h/o 2-3 beers daily before cutting back several years ago. No recent changes in medication other than warfarin.        Review of Systems Denies N/V/D, HA, SOB, Diaphoresis, syncope, hematemesis, hematochezia     Objective:   Physical Exam Gen: NAD, WN, WD Res: CTAB, normal effort CV: RRR, no m/r/g Abd: soft non-tender, NABS, non palpable liver and spleen Skin: no rashes, intact Musc: normal ROM, some mild pain on palpation and deep inspiration over the L lower ribcage extending from the midclavicular area to the mid scapular region on the back  Neuro: CN grossly intact       Assessment & Plan:

## 2011-07-26 NOTE — Patient Instructions (Addendum)
Zachary [name] Thank you for coming into clinic today  I will call you w/ the results of your labs today. Please try to minimize the amount of fried and fatty foods you eat at home. We will look into various medical therapies if still needed.   I think your chest pain is more of a musculoskeletal pain, but given your history of pneumothorax I would like for you to go to the emergency room if you should ever become short of breath with your chest pain becoming immediately worse.   I am overall pleased with how your health is, but would like to follow up in late November to early December to check on your chest pain, cholesterol, DVT, and liver function.

## 2011-07-27 LAB — HEPATITIS C ANTIBODY: HCV Ab: NEGATIVE

## 2011-07-27 LAB — COMPREHENSIVE METABOLIC PANEL
AST: 22 U/L (ref 0–37)
Albumin: 4.6 g/dL (ref 3.5–5.2)
BUN: 25 mg/dL — ABNORMAL HIGH (ref 6–23)
CO2: 19 mEq/L (ref 19–32)
Calcium: 9.2 mg/dL (ref 8.4–10.5)
Chloride: 106 mEq/L (ref 96–112)
Creat: 1.01 mg/dL (ref 0.50–1.35)
Glucose, Bld: 94 mg/dL (ref 70–99)
Potassium: 4.3 mEq/L (ref 3.5–5.3)

## 2011-07-27 LAB — HEPATITIS B SURFACE ANTIBODY,QUALITATIVE: Hep B S Ab: NEGATIVE

## 2011-07-27 LAB — LDL CHOLESTEROL, DIRECT: Direct LDL: 88 mg/dL

## 2011-07-28 ENCOUNTER — Telehealth: Payer: Self-pay | Admitting: Family Medicine

## 2011-07-28 DIAGNOSIS — R0789 Other chest pain: Secondary | ICD-10-CM | POA: Insufficient documentation

## 2011-07-28 NOTE — Assessment & Plan Note (Signed)
Elevated enzymes on 06/26/11. Etiology unclear. Possible lab error. Will obtain CMP today.  ALT > AST so Etoh unlikely. No h/o of hepatitis exposure but will obtain viral hepatitis panel today to r/o. Possibly NASH but w/o risk factors other than hyperlipidemia.

## 2011-07-28 NOTE — Assessment & Plan Note (Signed)
Will continue INR checks w/ goal of 2-2.5. Will reases in late November to early December (as pt will be 3months out from initial diagnoses). For to possibly stop warfarin therapy.

## 2011-07-28 NOTE — Telephone Encounter (Signed)
Spoke to pt at home and notified of lab results. No further intervention needed at this time  Will need to see pt back in early December instead of in 3 months to discuss stopping coumadin

## 2011-07-28 NOTE — Assessment & Plan Note (Signed)
Last lipid panel elevated but no clear if fasting. Pt home diet high in fatty foods. Discussed healthy low-fat alternatives. Will order direct LDL today to assess. If elevated will consider starting Statin such as pravastatin vs a fenofibrate due to past hx. Pt reports problems in past w/ lipitor and crestor w/ a bump in liver enzymes.   If normal then no intervention needed as Triglycerides are likely so elevated due to non fasting state. Total cholesterol not likely to be falsely elevated due to non-fasting state, but would like for the pt to make changes in lifestyle prior to starting medicine.

## 2011-07-28 NOTE — Assessment & Plan Note (Signed)
Recent hospitalization for complaint was unremarkable (no cardiac or PE etiology, likely musculoskeletal). Pain consistent w/ musculoskeletal type pain. Discussed shingles w/ pt and while unlikely, pt will call if becomes acutely more painful or if rash appears. Will follow up at next appt.

## 2011-08-02 ENCOUNTER — Ambulatory Visit (INDEPENDENT_AMBULATORY_CARE_PROVIDER_SITE_OTHER): Payer: Medicare Other | Admitting: *Deleted

## 2011-08-02 DIAGNOSIS — I824Z9 Acute embolism and thrombosis of unspecified deep veins of unspecified distal lower extremity: Secondary | ICD-10-CM

## 2011-08-02 DIAGNOSIS — Z7901 Long term (current) use of anticoagulants: Secondary | ICD-10-CM

## 2011-08-11 ENCOUNTER — Ambulatory Visit (INDEPENDENT_AMBULATORY_CARE_PROVIDER_SITE_OTHER): Payer: Medicare Other | Admitting: *Deleted

## 2011-08-11 DIAGNOSIS — Z7901 Long term (current) use of anticoagulants: Secondary | ICD-10-CM

## 2011-08-11 DIAGNOSIS — I824Z9 Acute embolism and thrombosis of unspecified deep veins of unspecified distal lower extremity: Secondary | ICD-10-CM

## 2011-08-18 ENCOUNTER — Ambulatory Visit (INDEPENDENT_AMBULATORY_CARE_PROVIDER_SITE_OTHER): Payer: Medicare Other | Admitting: *Deleted

## 2011-08-18 DIAGNOSIS — Z7901 Long term (current) use of anticoagulants: Secondary | ICD-10-CM

## 2011-08-18 DIAGNOSIS — I824Z9 Acute embolism and thrombosis of unspecified deep veins of unspecified distal lower extremity: Secondary | ICD-10-CM

## 2011-08-18 LAB — POCT INR: INR: 2

## 2011-08-19 ENCOUNTER — Telehealth: Payer: Self-pay | Admitting: Family Medicine

## 2011-08-19 ENCOUNTER — Other Ambulatory Visit: Payer: Self-pay | Admitting: Family Medicine

## 2011-08-19 DIAGNOSIS — I824Z9 Acute embolism and thrombosis of unspecified deep veins of unspecified distal lower extremity: Secondary | ICD-10-CM

## 2011-08-19 DIAGNOSIS — I82409 Acute embolism and thrombosis of unspecified deep veins of unspecified lower extremity: Secondary | ICD-10-CM | POA: Insufficient documentation

## 2011-08-19 MED ORDER — WARFARIN SODIUM 5 MG PO TABS: 5.0000 mg | ORAL_TABLET | Freq: Every day | ORAL | Status: DC

## 2011-08-19 NOTE — Telephone Encounter (Signed)
Zachary [name] is calling back because he hasn't heard anything on this refill.  He says he doesn't have any of this medicine for tomorrow.

## 2011-08-19 NOTE — Telephone Encounter (Signed)
Mr. Zachary [name] called asking for [name] for a refill on his Coumadin.  He was very difficult to understand, but all he needs is a refill sent to the [facility] on [address].

## 2011-08-22 ENCOUNTER — Emergency Department (HOSPITAL_COMMUNITY): Payer: Medicare Other

## 2011-08-22 ENCOUNTER — Emergency Department (HOSPITAL_COMMUNITY)
Admission: EM | Admit: 2011-08-22 | Discharge: 2011-08-22 | Disposition: A | Discharge status: Home / Self Care | Payer: Medicare Other | Attending: Emergency Medicine | Admitting: Emergency Medicine

## 2011-08-22 DIAGNOSIS — Z86718 Personal history of other venous thrombosis and embolism: Secondary | ICD-10-CM | POA: Insufficient documentation

## 2011-08-22 DIAGNOSIS — R42 Dizziness and giddiness: Secondary | ICD-10-CM | POA: Insufficient documentation

## 2011-08-22 DIAGNOSIS — M549 Dorsalgia, unspecified: Secondary | ICD-10-CM | POA: Insufficient documentation

## 2011-08-22 DIAGNOSIS — R1011 Right upper quadrant pain: Secondary | ICD-10-CM | POA: Insufficient documentation

## 2011-08-22 DIAGNOSIS — R630 Anorexia: Secondary | ICD-10-CM | POA: Insufficient documentation

## 2011-08-22 DIAGNOSIS — G8929 Other chronic pain: Secondary | ICD-10-CM | POA: Insufficient documentation

## 2011-08-22 DIAGNOSIS — R079 Chest pain, unspecified: Secondary | ICD-10-CM | POA: Insufficient documentation

## 2011-08-22 DIAGNOSIS — Z7901 Long term (current) use of anticoagulants: Secondary | ICD-10-CM | POA: Insufficient documentation

## 2011-08-22 DIAGNOSIS — R209 Unspecified disturbances of skin sensation: Secondary | ICD-10-CM | POA: Insufficient documentation

## 2011-08-22 LAB — PROTIME-INR
INR: 1.73 — ABNORMAL HIGH (ref 0.00–1.49)
Prothrombin Time: 20.6 seconds — ABNORMAL HIGH (ref 11.6–15.2)

## 2011-08-22 LAB — POCT I-STAT, CHEM 8
BUN: 30 mg/dL — ABNORMAL HIGH (ref 6–23)
Chloride: 108 mEq/L (ref 96–112)
Potassium: 3.5 mEq/L (ref 3.5–5.1)
Sodium: 140 mEq/L (ref 135–145)
TCO2: 21 mmol/L (ref 0–100)

## 2011-08-22 LAB — CK TOTAL AND CKMB (NOT AT ARMC)
CK, MB: 2.7 ng/mL (ref 0.3–4.0)
Relative Index: 1.7 (ref 0.0–2.5)

## 2011-08-22 LAB — BASIC METABOLIC PANEL
CO2: 24 mEq/L (ref 19–32)
Chloride: 105 mEq/L (ref 96–112)
GFR calc non Af Amer: 81 mL/min — ABNORMAL LOW (ref >90)
Glucose, Bld: 114 mg/dL — ABNORMAL HIGH (ref 70–99)
Potassium: 3.7 mEq/L (ref 3.5–5.1)
Sodium: 137 mEq/L (ref 135–145)

## 2011-08-22 LAB — DIFFERENTIAL
Basophils Absolute: 0 10*3/uL (ref 0.0–0.1)
Basophils Relative: 1 % (ref 0–1)
Eosinophils Absolute: 0.2 10*3/uL (ref 0.0–0.7)
Monocytes Absolute: 0.6 10*3/uL (ref 0.1–1.0)
Monocytes Relative: 10 % (ref 3–12)
Neutrophils Relative %: 43 % (ref 43–77)

## 2011-08-22 LAB — CBC
MCH: 31 pg (ref 26.0–34.0)
MCHC: 35.1 g/dL (ref 30.0–36.0)
Platelets: 250 10*3/uL (ref 150–400)
RBC: 4.78 MIL/uL (ref 4.22–5.81)

## 2011-08-22 MED ORDER — IOHEXOL 300 MG/ML  SOLN
100.0000 mL | Freq: Once | INTRAMUSCULAR | Status: AC | PRN
  Administered 2011-08-22: 100 mL via INTRAVENOUS

## 2011-08-23 ENCOUNTER — Ambulatory Visit (INDEPENDENT_AMBULATORY_CARE_PROVIDER_SITE_OTHER): Payer: Medicare Other | Admitting: *Deleted

## 2011-08-23 DIAGNOSIS — Z7901 Long term (current) use of anticoagulants: Secondary | ICD-10-CM

## 2011-08-23 DIAGNOSIS — I824Z9 Acute embolism and thrombosis of unspecified deep veins of unspecified distal lower extremity: Secondary | ICD-10-CM

## 2011-08-23 LAB — POCT INR: INR: 2.1

## 2011-09-01 ENCOUNTER — Ambulatory Visit (INDEPENDENT_AMBULATORY_CARE_PROVIDER_SITE_OTHER): Payer: Medicare Other | Admitting: *Deleted

## 2011-09-01 DIAGNOSIS — Z7901 Long term (current) use of anticoagulants: Secondary | ICD-10-CM

## 2011-09-01 DIAGNOSIS — I824Z9 Acute embolism and thrombosis of unspecified deep veins of unspecified distal lower extremity: Secondary | ICD-10-CM

## 2011-09-08 ENCOUNTER — Telehealth: Payer: Self-pay | Admitting: *Deleted

## 2011-09-08 DIAGNOSIS — I824Z9 Acute embolism and thrombosis of unspecified deep veins of unspecified distal lower extremity: Secondary | ICD-10-CM

## 2011-09-08 MED ORDER — WARFARIN SODIUM 5 MG PO TABS: 5.0000 mg | ORAL_TABLET | Freq: Every day | ORAL | Status: DC

## 2011-09-15 ENCOUNTER — Ambulatory Visit (INDEPENDENT_AMBULATORY_CARE_PROVIDER_SITE_OTHER): Payer: Medicare Other | Admitting: *Deleted

## 2011-09-15 DIAGNOSIS — I824Z9 Acute embolism and thrombosis of unspecified deep veins of unspecified distal lower extremity: Secondary | ICD-10-CM

## 2011-09-15 DIAGNOSIS — Z7901 Long term (current) use of anticoagulants: Secondary | ICD-10-CM

## 2011-09-15 LAB — POCT INR: INR: 1.9

## 2011-09-20 ENCOUNTER — Other Ambulatory Visit: Payer: Self-pay | Admitting: Family Medicine

## 2011-09-20 ENCOUNTER — Encounter: Payer: Self-pay | Admitting: Family Medicine

## 2011-09-20 ENCOUNTER — Ambulatory Visit (INDEPENDENT_AMBULATORY_CARE_PROVIDER_SITE_OTHER): Payer: Medicare Other | Admitting: Family Medicine

## 2011-09-20 ENCOUNTER — Ambulatory Visit (INDEPENDENT_AMBULATORY_CARE_PROVIDER_SITE_OTHER): Payer: Medicare Other | Admitting: *Deleted

## 2011-09-20 VITALS — BP 116/78 | HR 71 | Temp 98.0°F | Ht 73.0 in | Wt 204.0 lb

## 2011-09-20 DIAGNOSIS — D179 Benign lipomatous neoplasm, unspecified: Secondary | ICD-10-CM

## 2011-09-20 DIAGNOSIS — Z139 Encounter for screening, unspecified: Secondary | ICD-10-CM

## 2011-09-20 DIAGNOSIS — I824Z9 Acute embolism and thrombosis of unspecified deep veins of unspecified distal lower extremity: Secondary | ICD-10-CM

## 2011-09-20 DIAGNOSIS — Z23 Encounter for immunization: Secondary | ICD-10-CM

## 2011-09-20 DIAGNOSIS — Z129 Encounter for screening for malignant neoplasm, site unspecified: Secondary | ICD-10-CM

## 2011-09-20 DIAGNOSIS — E785 Hyperlipidemia, unspecified: Secondary | ICD-10-CM

## 2011-09-20 DIAGNOSIS — Z7901 Long term (current) use of anticoagulants: Secondary | ICD-10-CM

## 2011-09-20 DIAGNOSIS — Z125 Encounter for screening for malignant neoplasm of prostate: Secondary | ICD-10-CM

## 2011-09-20 DIAGNOSIS — I82409 Acute embolism and thrombosis of unspecified deep veins of unspecified lower extremity: Secondary | ICD-10-CM

## 2011-09-20 DIAGNOSIS — D6859 Other primary thrombophilia: Secondary | ICD-10-CM

## 2011-09-20 NOTE — Assessment & Plan Note (Signed)
Patient has now completed 3 month course of Coumadin after initial DVT. Will stop Coumadin today. Patient with history of long commutes, and prior bilateral inguinal hernia, but otherwise history is concerning for DVT set up. Low concern for hypercoagulable state do to malignancy do to the history of weight loss, night sweats, normal colonoscopy 3 years ago. Will test PSA today, but no complaints of back pain, dysuria or change in bowel habits. No family history of cancer. Discussed warning signs for future DVTs extensively.

## 2011-09-20 NOTE — Patient Instructions (Signed)
Thank you for coming into the clinic today. I think you're doing very well overall. I think you can stop taking your Coumadin as you have been without symptoms of lower term the pain or swelling for 3 months since her initial hospitalization. If you should again experience any lower extremity pain or swelling please come in to clinic for evaluation. I would like for you to get a PSA test today which is a screening test for prostate cancer. I think this should be normal and will call you if the results come back abnormal. As discussed during our clinic visit today you can do her regular diet now and can continue taking Tylenol or NSAIDs for pain. I will get a hold of a local surgery group who can see you for excision of the small cysts that you have. Please come see me in clinic again in approximately one year or sooner if you feel you need to be seen.

## 2011-09-20 NOTE — Progress Notes (Signed)
  Subjective:    Patient ID: Zachary [name], male    DOB: Jun 16, 1961, 50 y.o.   MRN: [id number]  HPI DVT: Patient without symptoms of DVT ever since discharge from hospital 3 months ago. Has been on Coumadin throughout this time. Was subtherapeutic to 1.6-1.9 for approximately one month of this time. Has had one other admission during this time period to the hospital for chest pain which was negative for PE or MI, and was thought to be merely due to musculoskeletal pain.  Subcutaneous nodules: Patient asked for referral today for a dermatologist to have them removed multiple subcutaneous nodules which cause pain and irritation when laying down on them. Patient reports history of having these removed by prior dermatologist in [place].     Review of Systems Denies chest pain, syncope, easy bruising, weight loss, shortness of breath, leg pain, lower extremity swelling    Objective:   Physical Exam  General: No acute distress, well-nourished well-developed HEENT: Moist mucous members, CV: Regular in rhythm, no murmurs rubs or gallops Respiratory: Clear to auscultation bilaterally, normal effort Abdominal: Normal active bowel sounds, nonpainful to palpation Extremities: No edema, 2+ pulses throughout Skin: No rashes, no petechiae or ecchymoses, numerous subcutaneous nodules on back, abdomen, arms, legs       Assessment & Plan:

## 2011-09-24 DIAGNOSIS — I82409 Acute embolism and thrombosis of unspecified deep veins of unspecified lower extremity: Secondary | ICD-10-CM | POA: Insufficient documentation

## 2011-09-24 NOTE — Assessment & Plan Note (Addendum)
Patient now greater than 3 months out from initial DVT. Patient okay to stop Coumadin at this time. Again reviewed past medical history looking for signs of hypercoagulability. Patient with history of bilateral inguinal hernia repair and long drives to [place] and other places for [employer]. Family history of malignancy. Low concern for malignancy do to negative indicators from past medical history and recent colonoscopy 3 years ago. Will obtain PSA today to rule out prostate involvement. Again discussed warning signs and preventative measures for DVTs.

## 2011-09-24 NOTE — Assessment & Plan Note (Signed)
Subcutaneous masses likely lipomas versus cysts. Will refer patient to Gen. surgery for consult for possible excision do to pain caused when lying on certain masses on his left chest wall.

## 2011-09-24 NOTE — Assessment & Plan Note (Signed)
Direct LDL 88 at last visit. No further intervention at this time.

## 2011-09-28 ENCOUNTER — Ambulatory Visit (INDEPENDENT_AMBULATORY_CARE_PROVIDER_SITE_OTHER): Payer: Medicare Other | Admitting: General Surgery

## 2011-10-12 ENCOUNTER — Encounter (INDEPENDENT_AMBULATORY_CARE_PROVIDER_SITE_OTHER): Payer: Self-pay | Admitting: General Surgery

## 2011-10-12 ENCOUNTER — Ambulatory Visit (INDEPENDENT_AMBULATORY_CARE_PROVIDER_SITE_OTHER): Payer: Medicare Other | Admitting: General Surgery

## 2011-10-12 VITALS — BP 102/66 | HR 72 | Temp 97.9°F | Resp 18 | Ht 72.0 in | Wt 200.8 lb

## 2011-10-12 DIAGNOSIS — D171 Benign lipomatous neoplasm of skin and subcutaneous tissue of trunk: Secondary | ICD-10-CM

## 2011-10-12 DIAGNOSIS — D1779 Benign lipomatous neoplasm of other sites: Secondary | ICD-10-CM

## 2011-10-12 NOTE — Progress Notes (Signed)
Patient ID: Zachary [name], male   DOB: April 16, 1961, 50 y.o.   MRN: [id number]  Chief Complaint  Patient presents with  . Other    Lipoma    HPI Zachary [name] is a 50 y.o. male.  Referred by Dr. Mauricio Po HPI This is a 50 year old male who has a history of multiple lipomas. He has had several removed on his left leg, left arm, and scalp. He has 2 on his left flank and back right now that her prevented him from sleeping well. He comes in today to discuss removal. These have gotten a little bit bigger over some time. He has no history of any infection of these areas. He does have a recent history of a deep venous thrombosis that apparently was idiopathic that he has completed treatment for.  Past Medical History  Diagnosis Date  . Right leg DVT august 2012  . Chronic back pain   . History of depression   . Diverticulosis 2006  . History of pneumothorax   . Hypertension   . Lipoma     Past Surgical History  Procedure Date  . Spinal steroid injections   . Chest tube insertion     for pneumothorax  . Inguinal hernia repair 2002, 2003    x2  . Cystectomy 2010, 2011  . Cholecystectomy 2005    Family History  Problem Relation Age of Onset  . Stroke Father   . Heart attack Mother   . Heart disease Mother     heart attack    Social History History  Substance Use Topics  . Smoking status: Never Smoker   . Smokeless tobacco: Never Used  . Alcohol Use: No    No Known Allergies  Current Outpatient Prescriptions  Medication Sig Dispense Refill  . Chelated Magnesium 100 MG TABS Take 200 mg by mouth at bedtime.        . Cholecalciferol (VITAMIN D3) 5000 UNITS CAPS Take 1 capsule by mouth daily.        . OMEGA 3 1000 MG CAPS Take 1 capsule by mouth 2 (two) times daily.        . saw palmetto 500 MG capsule Take 500 mg by mouth daily.          Review of Systems Review of Systems  Constitutional: Negative for fever, chills and unexpected weight change.  HENT: Negative for  hearing loss, congestion, sore throat, trouble swallowing and voice change.   Eyes: Negative for visual disturbance.  Respiratory: Negative for cough and wheezing.   Cardiovascular: Negative for chest pain, palpitations and leg swelling.  Gastrointestinal: Negative for nausea, vomiting, abdominal pain, diarrhea, constipation, blood in stool, abdominal distention, anal bleeding and rectal pain.  Genitourinary: Negative for hematuria and difficulty urinating.  Musculoskeletal: Negative for arthralgias.  Skin: Negative for rash and wound.  Neurological: Negative for seizures, syncope, weakness and headaches.  Hematological: Negative for adenopathy. Does not bruise/bleed easily.  Psychiatric/Behavioral: Negative for confusion.    Blood pressure 102/66, pulse 72, temperature 97.9 F (36.6 C), temperature source Temporal, resp. rate 18, height 6' (1.829 m), weight 200 lb 12.8 oz (91.082 kg).  Physical Exam Physical Exam  Constitutional: He appears well-developed and well-nourished.  Neck: Neck supple.  Cardiovascular: Normal rate, regular rhythm and normal heart sounds.   Pulmonary/Chest: Effort normal and breath sounds normal. He has no wheezes. He has no rales.    Lymphadenopathy:    He has no cervical adenopathy.      Assessment Lipomatosis Symptomatic left  back lipomas    Plan   He has multiple lipomas present. All of these clinically are lipomas. He has 2 that are bothering him that we discussed excision of. I think this would be best done over a day surgery today have some more equipment over there and I will plan on doing these under local. We discussed the risks including bleeding, infection.         Zachary [name] 10/12/2011, 9:16 AM

## 2011-10-28 ENCOUNTER — Encounter (HOSPITAL_BASED_OUTPATIENT_CLINIC_OR_DEPARTMENT_OTHER)
Admission: RE | Disposition: A | Discharge status: Home / Self Care | Payer: Self-pay | Source: Ambulatory Visit | Attending: General Surgery

## 2011-10-28 ENCOUNTER — Ambulatory Visit (HOSPITAL_BASED_OUTPATIENT_CLINIC_OR_DEPARTMENT_OTHER)
Admission: RE | Admit: 2011-10-28 | Discharge: 2011-10-28 | Disposition: A | Discharge status: Home / Self Care | Payer: Medicare Other | Source: Ambulatory Visit | Attending: General Surgery | Admitting: General Surgery

## 2011-10-28 ENCOUNTER — Other Ambulatory Visit (INDEPENDENT_AMBULATORY_CARE_PROVIDER_SITE_OTHER): Payer: Self-pay | Admitting: General Surgery

## 2011-10-28 DIAGNOSIS — D1739 Benign lipomatous neoplasm of skin and subcutaneous tissue of other sites: Secondary | ICD-10-CM | POA: Insufficient documentation

## 2011-10-28 DIAGNOSIS — I10 Essential (primary) hypertension: Secondary | ICD-10-CM | POA: Insufficient documentation

## 2011-10-28 HISTORY — PX: LIPOMA EXCISION: SHX5283

## 2011-10-28 SURGERY — MINOR EXCISION LIPOMA
Anesthesia: LOCAL

## 2011-10-28 MED ORDER — HYDROCODONE-ACETAMINOPHEN 10-325 MG PO TABS: 1.0000 | ORAL_TABLET | Freq: Four times a day (QID) | ORAL | Status: DC | PRN

## 2011-10-28 MED ORDER — LIDOCAINE-EPINEPHRINE (PF) 1 %-1:200000 IJ SOLN
INTRAMUSCULAR | Status: DC | PRN
  Administered 2011-10-28: 12:00:00 via INTRAMUSCULAR

## 2011-10-28 SURGICAL SUPPLY — 19 items
BLADE SURG 15 STRL LF DISP TIS (BLADE) ×1 IMPLANT
BLADE SURG 15 STRL SS (BLADE) ×1
CHLORAPREP W/TINT 26ML (MISCELLANEOUS) ×2 IMPLANT
CLOTH BEACON ORANGE TIMEOUT ST (SAFETY) ×2 IMPLANT
DERMABOND ADVANCED (GAUZE/BANDAGES/DRESSINGS) ×1
DERMABOND ADVANCED .7 DNX12 (GAUZE/BANDAGES/DRESSINGS) ×1 IMPLANT
DRAPE PED LAPAROTOMY (DRAPES) ×2 IMPLANT
ELECT COATED BLADE 2.86 ST (ELECTRODE) IMPLANT
ELECT REM PT RETURN 9FT ADLT (ELECTROSURGICAL) ×2
ELECTRODE REM PT RTRN 9FT ADLT (ELECTROSURGICAL) ×1 IMPLANT
GLOVE BIO SURGEON STRL SZ7 (GLOVE) ×4 IMPLANT
GOWN PREVENTION PLUS XLARGE (GOWN DISPOSABLE) ×2 IMPLANT
NEEDLE HYPO 25X1 1.5 SAFETY (NEEDLE) ×2 IMPLANT
PENCIL BUTTON HOLSTER BLD 10FT (ELECTRODE) ×2 IMPLANT
SUT MON AB 4-0 PC3 18 (SUTURE) ×2 IMPLANT
SUT VIC AB 3-0 SH 27 (SUTURE) ×1
SUT VIC AB 3-0 SH 27X BRD (SUTURE) ×1 IMPLANT
SYR CONTROL 10ML LL (SYRINGE) ×2 IMPLANT
TOWEL OR 17X24 6PK STRL BLUE (TOWEL DISPOSABLE) IMPLANT

## 2011-10-28 NOTE — H&P (View-Only) (Signed)
Patient ID: Zachary [name], male   DOB: 07/07/1961, 50 y.o.   MRN: [id number]  Chief Complaint  Patient presents with  . Other    Lipoma    HPI Zachary [name] is a 50 y.o. male.  Referred by Dr. Breen HPI [name] who has a history of multiple lipomas. He has had several removed on his left leg, left arm, and scalp. He has 2 on his left flank and back right now that her prevented him from sleeping well. He comes in today to discuss removal. These have gotten a little bit bigger over some time. He has no history of any infection of these areas. He does have a recent history of a deep venous thrombosis that apparently was idiopathic that he has completed treatment for.  Past Medical History  Diagnosis Date  . Right leg DVT august 2012  . Chronic back pain   . History of depression   . Diverticulosis 2006  . History of pneumothorax   . Hypertension   . Lipoma     Past Surgical History  Procedure Date  . Spinal steroid injections   . Chest tube insertion     for pneumothorax  . Inguinal hernia repair 2002, 2003    x2  . Cystectomy 2010, 2011  . Cholecystectomy 2005    Family History  Problem Relation Age of Onset  . Stroke Father   . Heart attack Mother   . Heart disease Mother     heart attack    Social History History  Substance Use Topics  . Smoking status: Never Smoker   . Smokeless tobacco: Never Used  . Alcohol Use: No    No Known Allergies  Current Outpatient Prescriptions  Medication Sig Dispense Refill  . Chelated Magnesium 100 MG TABS Take 200 mg by mouth at bedtime.        . Cholecalciferol (VITAMIN D3) 5000 UNITS CAPS Take 1 capsule by mouth daily.        . OMEGA 3 1000 MG CAPS Take 1 capsule by mouth 2 (two) times daily.        . saw palmetto 500 MG capsule Take 500 mg by mouth daily.          Review of Systems Review of Systems  Constitutional: Negative for fever, chills and unexpected weight change.  HENT: Negative for  hearing loss, congestion, sore throat, trouble swallowing and voice change.   Eyes: Negative for visual disturbance.  Respiratory: Negative for cough and wheezing.   Cardiovascular: Negative for chest pain, palpitations and leg swelling.  Gastrointestinal: Negative for nausea, vomiting, abdominal pain, diarrhea, constipation, blood in stool, abdominal distention, anal bleeding and rectal pain.  Genitourinary: Negative for hematuria and difficulty urinating.  Musculoskeletal: Negative for arthralgias.  Skin: Negative for rash and wound.  Neurological: Negative for seizures, syncope, weakness and headaches.  Hematological: Negative for adenopathy. Does not bruise/bleed easily.  Psychiatric/Behavioral: Negative for confusion.    Blood pressure 102/66, pulse 72, temperature 97.9 F (36.6 C), temperature source Temporal, resp. rate 18, height 6' (1.829 m), weight 200 lb 12.8 oz (91.082 kg).  Physical Exam Physical Exam  Constitutional: He appears well-developed and well-nourished.  Neck: Neck supple.  Cardiovascular: Normal rate, regular rhythm and normal heart sounds.   Pulmonary/Chest: Effort normal and breath sounds normal. He has no wheezes. He has no rales.    Lymphadenopathy:    He has no cervical adenopathy.      Assessment Lipomatosis Symptomatic left   back lipomas    Plan   He has multiple lipomas present. All of these clinically are lipomas. He has 2 that are bothering him that we discussed excision of. I think this would be best done over a day surgery today have some more equipment over there and I will plan on doing these under local. We discussed the risks including bleeding, infection.         Zachary [name] 10/12/2011, 9:16 AM    

## 2011-10-28 NOTE — Op Note (Signed)
Preoperative diagnosis: Back lipoma x2 Postoperative diagnosis: Same as above  Procedure: #1 excision of superior left back lipoma, subcutaneous, 2 x 2 centimeters #2 excision of inferior left back lipoma, subcutaneous, 4 x 4 cm Surgeon: Dr. Harden Mo Anesthesia: Local Specimens: Back lipoma x2 to pathology Complications: None Estimated blood loss: Minimal Disposition to postop in stable condition  Indications: This is a 51 year old male with lipomatosis who presents with 2 symptomatic left back masses. These appear to be lipomas on exam. We discussed excision of these under local anesthetic.  Procedure: After informed consent was obtained the patient was taken to the operating room. We did this completely under local anesthesia. His left back was prepped and draped in the standard sterile surgical fashion. A surgical timeout was performed.  I then anesthetized both of these areas with a 50-50 mixture of 1% lidocaine with epinephrine and quarter percent Marcaine. I then made an incision at the superior one. This was then removed in total. I placed a sponge in this incision. I then made an incision overlying the larger lipoma in the inferior position. This was adherent to his fascia and was removed in its entirety with electrocautery. I then placed a sponge in this incision. I then obtained hemostasis and the superior incision and closed it with 3-0 Vicryl and 4 Monocryl. I then obtained hemostasis in the inferior incision and closed it in a similar fashion. Dermabond was placed over both incisions. He tolerated this well.

## 2011-10-28 NOTE — Interval H&P Note (Signed)
History and Physical Interval Note:  10/28/2011 11:44 AM  Zachary [name]  has presented today for surgery, with the diagnosis of back lipomas  The various methods of treatment have been discussed with the patient and family. After consideration of risks, benefits and other options for treatment, the patient has consented to  Procedure(s): MINOR EXCISION LIPOMA as a surgical intervention .  The patients' history has been reviewed, patient examined, no change in status, stable for surgery.  I have reviewed the patients' chart and labs.  Questions were answered to the patient's satisfaction.     Arlita Buffkin

## 2011-10-29 ENCOUNTER — Telehealth (INDEPENDENT_AMBULATORY_CARE_PROVIDER_SITE_OTHER): Payer: Self-pay

## 2011-10-29 NOTE — Telephone Encounter (Signed)
Patient made aware of path results. Will follow up at appt and call with any questions prior. States he took some hydrocodone and advil and his headache is better. He is still having some weakness but feels like that is getting better as well. He did state that when he checked his pulse it was low, 54. I advised him to keep an eye on that and if it stays low and he has symptoms of fatigue he should see his primary MD or go to the ER. This just started this am. He denies any episodes like this in the past.

## 2011-10-29 NOTE — Telephone Encounter (Signed)
Message copied by Liliana Cline on Fri Oct 29, 2011  1:16 PM ------      Message from: [place], [place]      Created: Fri Oct 29, 2011 12:33 PM       Please call and tell him these are benign lipomas.        ----- Message -----         From: [facility]         Sent: 10/29/2011  11:52 AM           To: Emelia Loron, MD

## 2011-10-29 NOTE — Telephone Encounter (Signed)
Patients daughter called and said her father has had a real bad headache since released yesterday and he has no energy. Patient eating ok and using bathroom ok. Please advise.Marland Kitchen

## 2011-11-01 ENCOUNTER — Encounter (HOSPITAL_BASED_OUTPATIENT_CLINIC_OR_DEPARTMENT_OTHER): Payer: Self-pay | Admitting: General Surgery

## 2011-11-29 ENCOUNTER — Ambulatory Visit (INDEPENDENT_AMBULATORY_CARE_PROVIDER_SITE_OTHER): Payer: Medicare Other | Admitting: General Surgery

## 2011-11-29 ENCOUNTER — Encounter (INDEPENDENT_AMBULATORY_CARE_PROVIDER_SITE_OTHER): Payer: Self-pay | Admitting: General Surgery

## 2011-11-29 VITALS — BP 106/78 | HR 76 | Temp 97.2°F | Resp 18 | Ht 73.0 in | Wt 209.4 lb

## 2011-11-29 DIAGNOSIS — Z09 Encounter for follow-up examination after completed treatment for conditions other than malignant neoplasm: Secondary | ICD-10-CM

## 2011-11-29 NOTE — Patient Instructions (Signed)
Has two left UE lipomas that are both 2x2cm subq in nature for excision in office

## 2011-11-29 NOTE — Progress Notes (Signed)
Subjective:     Patient ID: Zachary [name], male   DOB: Oct 22, 1961, 51 y.o.   MRN: [id number]  HPI This is a 51 year old male with lipomatosis. I excised 2 back lipomas about a month ago now. The pathology shows only lipoma. He has done well and is without complaints. He also has to others he would like to consider removal of his left arm. These are both symptomatic to him. We discussed removing these in the office.  Review of Systems     Objective:   Physical Exam  Musculoskeletal:       Arms:  Well healed back incisions without infection    Assessment:     S/p back lipoma excision Left UE lipomas    Plan:     Doing well after excision Will plan on excising two LUE in office

## 2011-12-01 ENCOUNTER — Encounter (INDEPENDENT_AMBULATORY_CARE_PROVIDER_SITE_OTHER): Payer: Self-pay | Admitting: General Surgery

## 2011-12-01 ENCOUNTER — Ambulatory Visit (INDEPENDENT_AMBULATORY_CARE_PROVIDER_SITE_OTHER): Payer: Medicare Other | Admitting: General Surgery

## 2011-12-01 VITALS — BP 127/77 | HR 73 | Temp 96.8°F | Resp 18 | Ht 72.0 in | Wt 204.2 lb

## 2011-12-01 DIAGNOSIS — D1720 Benign lipomatous neoplasm of skin and subcutaneous tissue of unspecified limb: Secondary | ICD-10-CM

## 2011-12-01 DIAGNOSIS — D1779 Benign lipomatous neoplasm of other sites: Secondary | ICD-10-CM

## 2011-12-01 NOTE — Progress Notes (Signed)
Subjective:     Patient ID: Zachary [name], male   DOB: Jun 17, 1961, 51 y.o.   MRN: [id number]  HPI 64 yom presents for excision of two left upper extremity lipomas.  No complaints or any changes since last visit earlier this week.  Review of Systems     Objective:   Physical Exam Left triceps mass 2x2 cm mobile c/w lipoma Left forearm 2.2 cm mobile mass c/w lipoma    Assessment:     Multiple symptomatic lipomas    Plan:     We discussed excision.  I then cleansed the area with betadine and infiltrated 1% lidocaine with epi.  I then made incisions over both and removed what appear to be lipomas.  I then closed with 3-0 vicryl and 4-0 monocryl.  Dermabond was then used to close.  He was discharged home.

## 2011-12-01 NOTE — Progress Notes (Signed)
Addended by: Latricia Heft on: 12/01/2011 03:30 PM   Modules accepted: Orders

## 2011-12-01 NOTE — Patient Instructions (Signed)
[facility]  POST OP INSTRUCTIONS   1. A prescription for pain medication may be given to you upon discharge.  Take your pain medication as prescribed, if needed.  If narcotic pain medicine is not needed, then you may take acetaminophen (Tylenol), naprosyn (Alleve), or ibuprofen (Advil) as needed. 2. Take your usually prescribed medications unless otherwise directed. 3. If you need a refill on your pain medication, please contact your pharmacy.  They will contact our office to request authorization. Prescriptions will not be filled after 5pm or on week-end 4. Most patients will experience some swelling and bruising in the area of the incisions.  Ice packs will help.  Swelling and bruising can take several days to resolve. . 5.   I used skin glue on the incision, you may shower in 24 hours.  The glue will flake off over the next 2-3 weeks.  Any sutures or staples will be removed at the office during your follow-up visit. 6. ACTIVITIES:  You may resume regular (light) daily activities beginning the next day-such as daily self-care, walking, climbing stairs-gradually increasing activities as tolerated.  7. You should see your doctor in the office for a follow-up appointment approximately 3 weeks after your surgery.  Make sure that you call for this appointment within a day or two after you arrive home to insure a convenient appointment time. 8. OTHER INSTRUCTIONS: __________________________________________________________________________________________________________________________ __________________________________________________________________________________________________________________________ WHEN TO CALL YOUR DOCTOR: 1. Fever over 101.0 2. Inability to urinate 3. Continued bleeding from incision. 4. Increased pain, redness, or drainage from the incision. 5. Increasing abdominal pain  The clinic staff is available to answer your  questions during regular business hours.  Please don't hesitate to call and ask to speak to one of the nurses for clinical concerns.  If you have a medical emergency, go to the nearest emergency room or call 911.  A surgeon from [facility] is always on call at the hospital. [address], [place], [place]  [address] ? [address] [address], [place], [place]   [address] [phone] ? [phone] ? FAX [phone] Web site: [email or web address]

## 2011-12-03 ENCOUNTER — Telehealth (INDEPENDENT_AMBULATORY_CARE_PROVIDER_SITE_OTHER): Payer: Self-pay

## 2011-12-03 NOTE — Telephone Encounter (Signed)
Called pt to notify her that her path is benign showing that they were lipomas.

## 2012-01-17 ENCOUNTER — Encounter (INDEPENDENT_AMBULATORY_CARE_PROVIDER_SITE_OTHER): Payer: Medicare Other | Admitting: General Surgery

## 2012-01-17 DIAGNOSIS — G8929 Other chronic pain: Secondary | ICD-10-CM | POA: Insufficient documentation

## 2012-03-04 ENCOUNTER — Encounter (HOSPITAL_BASED_OUTPATIENT_CLINIC_OR_DEPARTMENT_OTHER): Payer: Self-pay | Admitting: *Deleted

## 2012-03-04 ENCOUNTER — Emergency Department (HOSPITAL_BASED_OUTPATIENT_CLINIC_OR_DEPARTMENT_OTHER)
Admission: EM | Admit: 2012-03-04 | Discharge: 2012-03-04 | Disposition: A | Discharge status: Home / Self Care | Payer: Medicare Other | Attending: Emergency Medicine | Admitting: Emergency Medicine

## 2012-03-04 ENCOUNTER — Emergency Department (INDEPENDENT_AMBULATORY_CARE_PROVIDER_SITE_OTHER): Payer: Medicare Other

## 2012-03-04 DIAGNOSIS — M549 Dorsalgia, unspecified: Secondary | ICD-10-CM | POA: Insufficient documentation

## 2012-03-04 DIAGNOSIS — G8929 Other chronic pain: Secondary | ICD-10-CM | POA: Insufficient documentation

## 2012-03-04 DIAGNOSIS — I10 Essential (primary) hypertension: Secondary | ICD-10-CM | POA: Insufficient documentation

## 2012-03-04 DIAGNOSIS — R109 Unspecified abdominal pain: Secondary | ICD-10-CM

## 2012-03-04 DIAGNOSIS — R10819 Abdominal tenderness, unspecified site: Secondary | ICD-10-CM | POA: Insufficient documentation

## 2012-03-04 DIAGNOSIS — R61 Generalized hyperhidrosis: Secondary | ICD-10-CM

## 2012-03-04 DIAGNOSIS — Z9889 Other specified postprocedural states: Secondary | ICD-10-CM | POA: Insufficient documentation

## 2012-03-04 DIAGNOSIS — Z86718 Personal history of other venous thrombosis and embolism: Secondary | ICD-10-CM | POA: Insufficient documentation

## 2012-03-04 DIAGNOSIS — K7689 Other specified diseases of liver: Secondary | ICD-10-CM

## 2012-03-04 LAB — DIFFERENTIAL
Basophils Relative: 0 % (ref 0–1)
Eosinophils Absolute: 0.2 10*3/uL (ref 0.0–0.7)
Eosinophils Relative: 3 % (ref 0–5)
Monocytes Absolute: 0.8 10*3/uL (ref 0.1–1.0)
Monocytes Relative: 10 % (ref 3–12)

## 2012-03-04 LAB — COMPREHENSIVE METABOLIC PANEL
Albumin: 3.9 g/dL (ref 3.5–5.2)
BUN: 21 mg/dL (ref 6–23)
Calcium: 9.2 mg/dL (ref 8.4–10.5)
Creatinine, Ser: 1.1 mg/dL (ref 0.50–1.35)
Total Bilirubin: 0.5 mg/dL (ref 0.3–1.2)
Total Protein: 6.7 g/dL (ref 6.0–8.3)

## 2012-03-04 LAB — URINALYSIS, ROUTINE W REFLEX MICROSCOPIC
Glucose, UA: NEGATIVE mg/dL
Hgb urine dipstick: NEGATIVE
Protein, ur: NEGATIVE mg/dL

## 2012-03-04 LAB — CBC
HCT: 40.5 % (ref 39.0–52.0)
MCV: 87.3 fL (ref 78.0–100.0)
RBC: 4.64 MIL/uL (ref 4.22–5.81)
RDW: 13.3 % (ref 11.5–15.5)
WBC: 7.5 10*3/uL (ref 4.0–10.5)

## 2012-03-04 LAB — LIPASE, BLOOD: Lipase: 26 U/L (ref 11–59)

## 2012-03-04 MED ORDER — IOHEXOL 300 MG/ML  SOLN
40.0000 mL | Freq: Once | INTRAMUSCULAR | Status: AC | PRN
  Administered 2012-03-04: 40 mL via ORAL

## 2012-03-04 MED ORDER — ONDANSETRON HCL 4 MG/2ML IJ SOLN
4.0000 mg | Freq: Once | INTRAMUSCULAR | Status: AC
  Administered 2012-03-04: 4 mg via INTRAVENOUS
  Filled 2012-03-04: qty 2

## 2012-03-04 MED ORDER — HYDROMORPHONE HCL PF 1 MG/ML IJ SOLN
1.0000 mg | Freq: Once | INTRAMUSCULAR | Status: AC
  Administered 2012-03-04: 1 mg via INTRAVENOUS
  Filled 2012-03-04: qty 1

## 2012-03-04 MED ORDER — SODIUM CHLORIDE 0.9 % IV SOLN
1000.0000 mL | INTRAVENOUS | Status: DC
  Administered 2012-03-04: 1000 mL via INTRAVENOUS

## 2012-03-04 MED ORDER — SODIUM CHLORIDE 0.9 % IV SOLN
1000.0000 mL | Freq: Once | INTRAVENOUS | Status: AC
  Administered 2012-03-04: 1000 mL via INTRAVENOUS

## 2012-03-04 MED ORDER — HYDROCODONE-ACETAMINOPHEN 5-325 MG PO TABS: 2.0000 | ORAL_TABLET | ORAL | Status: DC | PRN

## 2012-03-04 MED ORDER — IOHEXOL 300 MG/ML  SOLN
100.0000 mL | Freq: Once | INTRAMUSCULAR | Status: AC | PRN
  Administered 2012-03-04: 100 mL via INTRAVENOUS

## 2012-03-04 NOTE — ED Provider Notes (Signed)
History     CSN: [id number]  Arrival date & time 03/04/12  1705   First MD Initiated Contact with Patient 03/04/12 1756      Chief Complaint  Patient presents with  . Abdominal Pain    (Consider location/radiation/quality/duration/timing/severity/associated sxs/prior treatment) Patient is a 51 y.o. male presenting with abdominal pain. The history is provided by the patient. No language interpreter was used.  Abdominal Pain The primary symptoms of the illness include abdominal pain. The current episode started 6 to 12 hours ago. The onset of the illness was gradual. The problem has been gradually worsening.  Associated with: nothing. The patient has not had a change in bowel habit. Risk factors for an acute abdominal problem include a history of abdominal surgery. Symptoms associated with the illness do not include hematuria or back pain. Significant associated medical issues include inflammatory bowel disease. Significant associated medical issues do not include PUD, GERD, gallstones or liver disease.  Pt complains of lower abdominal pain.  Pt reports he has had diverticulitis and has had a perforation in the past that required surgery  Past Medical History  Diagnosis Date  . Right leg DVT august 2012  . Chronic back pain   . History of depression   . Diverticulosis 2006  . History of pneumothorax   . Hypertension   . Lipoma     Past Surgical History  Procedure Date  . Spinal steroid injections   . Chest tube insertion     for pneumothorax  . Inguinal hernia repair 2002, 2003    x2  . Cystectomy 2010, 2011  . Cholecystectomy 2005  . Lipoma excision 10/28/2011    Procedure: MINOR EXCISION LIPOMA;  Surgeon: Emelia Loron, MD;  Location: [facility];  Service: General;  Laterality: N/A;  2 back lipoma excison  1 x 2 cm subcutaneous, 3 x4 cm subcutaneous    Family History  Problem Relation Age of Onset  . Stroke Father   . Heart attack Mother   . Heart  disease Mother     heart attack    History  Substance Use Topics  . Smoking status: Never Smoker   . Smokeless tobacco: Never Used  . Alcohol Use: No      Review of Systems  Gastrointestinal: Positive for abdominal pain.  Genitourinary: Negative for hematuria.  Musculoskeletal: Negative for back pain.  All other systems reviewed and are negative.    Allergies  Review of patient's allergies indicates no known allergies.  Home Medications   Current Outpatient Rx  Name Route Sig Dispense Refill  . IBUPROFEN 200 MG PO TABS Oral Take 600 mg by mouth every 6 (six) hours as needed. Patient used this medication for pain.    Marland Kitchen OMEGA 3 1000 MG PO CAPS Oral Take 1 capsule by mouth 2 (two) times daily.      . SAW PALMETTO (SERENOA REPENS) 500 MG PO CAPS Oral Take 500 mg by mouth daily.      . CHELATED MAGNESIUM 100 MG PO TABS Oral Take 200 mg by mouth at bedtime.      Marland Kitchen VITAMIN D3 5000 UNITS PO CAPS Oral Take 1 capsule by mouth daily.        BP 118/77  Pulse 72  Temp(Src) 97.7 F (36.5 C) (Oral)  Resp 20  Ht 6' (1.829 m)  Wt 205 lb (92.987 kg)  BMI 27.80 kg/m2  SpO2 98%  Physical Exam  Nursing note reviewed. Constitutional: He is oriented to person,  place, and time. He appears well-developed and well-nourished.  HENT:  Head: Normocephalic and atraumatic.  Right Ear: External ear normal.  Left Ear: External ear normal.  Nose: Nose normal.  Mouth/Throat: Oropharynx is clear and moist.  Eyes: Conjunctivae and EOM are normal. Pupils are equal, round, and reactive to light.  Neck: Normal range of motion. Neck supple.  Cardiovascular: Normal rate and normal heart sounds.   Pulmonary/Chest: Effort normal and breath sounds normal.  Abdominal: Soft. There is tenderness.  Musculoskeletal: Normal range of motion.  Neurological: He is alert and oriented to person, place, and time. He has normal reflexes.  Skin: Skin is warm.  Psychiatric: He has a normal mood and affect.     ED Course  Procedures (including critical care time)   Labs Reviewed  URINALYSIS, ROUTINE W REFLEX MICROSCOPIC  CBC  DIFFERENTIAL  COMPREHENSIVE METABOLIC PANEL  LIPASE, BLOOD   No results found.   No diagnosis found.    MDM   Results for orders placed during the hospital encounter of 03/04/12  URINALYSIS, ROUTINE W REFLEX MICROSCOPIC      Component Value Range   Color, Urine YELLOW  YELLOW    APPearance CLEAR  CLEAR    Specific Gravity, Urine 1.023  1.005 - 1.030    pH 5.5  5.0 - 8.0    Glucose, UA NEGATIVE  NEGATIVE (mg/dL)   Hgb urine dipstick NEGATIVE  NEGATIVE    Bilirubin Urine NEGATIVE  NEGATIVE    Ketones, ur NEGATIVE  NEGATIVE (mg/dL)   Protein, ur NEGATIVE  NEGATIVE (mg/dL)   Urobilinogen, UA 0.2  0.0 - 1.0 (mg/dL)   Nitrite NEGATIVE  NEGATIVE    Leukocytes, UA NEGATIVE  NEGATIVE   CBC      Component Value Range   WBC 7.5  4.0 - 10.5 (K/uL)   RBC 4.64  4.22 - 5.81 (MIL/uL)   Hemoglobin 14.1  13.0 - 17.0 (g/dL)   HCT 45.4  09.8 - 11.9 (%)   MCV 87.3  78.0 - 100.0 (fL)   MCH 30.4  26.0 - 34.0 (pg)   MCHC 34.8  30.0 - 36.0 (g/dL)   RDW 14.7  82.9 - 56.2 (%)   Platelets 246  150 - 400 (K/uL)  DIFFERENTIAL      Component Value Range   Neutrophils Relative 52  43 - 77 (%)   Neutro Abs 3.9  1.7 - 7.7 (K/uL)   Lymphocytes Relative 35  12 - 46 (%)   Lymphs Abs 2.6  0.7 - 4.0 (K/uL)   Monocytes Relative 10  3 - 12 (%)   Monocytes Absolute 0.8  0.1 - 1.0 (K/uL)   Eosinophils Relative 3  0 - 5 (%)   Eosinophils Absolute 0.2  0.0 - 0.7 (K/uL)   Basophils Relative 0  0 - 1 (%)   Basophils Absolute 0.0  0.0 - 0.1 (K/uL)  COMPREHENSIVE METABOLIC PANEL      Component Value Range   Sodium 139  135 - 145 (mEq/L)   Potassium 3.8  3.5 - 5.1 (mEq/L)   Chloride 105  96 - 112 (mEq/L)   CO2 25  19 - 32 (mEq/L)   Glucose, Bld 95  70 - 99 (mg/dL)   BUN 21  6 - 23 (mg/dL)   Creatinine, Ser 1.30  0.50 - 1.35 (mg/dL)   Calcium 9.2  8.4 - 86.5 (mg/dL)   Total  Protein 6.7  6.0 - 8.3 (g/dL)   Albumin 3.9  3.5 - 5.2 (  g/dL)   AST 18  0 - 37 (U/L)   ALT 34  0 - 53 (U/L)   Alkaline Phosphatase 65  39 - 117 (U/L)   Total Bilirubin 0.5  0.3 - 1.2 (mg/dL)   GFR calc non Af Amer 76 (*) >90 (mL/min)   GFR calc Af Amer 88 (*) >90 (mL/min)  LIPASE, BLOOD      Component Value Range   Lipase 26  11 - 59 (U/L)   Ct Abdomen Pelvis W Contrast  03/04/2012  *RADIOLOGY REPORT*  Clinical Data: Abdominal pain and diaphoresis.  CT ABDOMEN AND PELVIS WITH CONTRAST  Technique:  Multidetector CT imaging of the abdomen and pelvis was performed following the standard protocol during bolus administration of intravenous contrast.  Contrast: 40mL OMNIPAQUE IOHEXOL 300 MG/ML  SOLN, OMNIPAQUE IOHEXOL 300 MG/ML  SOLN  Comparison: CT chest 08/22/2011  Findings: Dependent atelectasis in the lung bases.  Vague 8 mm diameter low attenuation lesion centrally in the liver appears stable since the previous study.  This is too small to characterize, but is likely benign due to stability.  A second vague low attenuation lesion is present in the posterior segment right lobe of the liver superiorly at 10 mm diameter. A third 10 mm hypodense nodule is demonstrated in the inferior segment of the right lobe of the liver.  These lesions were not definitely present on the previous study. Statistically, these lesions most likely represent cysts or hemangiomas.  If the patient's primary carcinoma where there is clinical concern for infection or metastasis, then MRI would be useful in further evaluation.  Surgical absence of the gallbladder.  Physiologic prominence of extrahepatic bile ducts.  Spleen, pancreas, adrenal glands, abdominal aorta, and retroperitoneal lymph nodes are unremarkable. Parenchymal cysts in the right kidney measures 11 mm diameter.  No solid mass or hydronephrosis in either kidney.  The stomach and small bowel are not dilated.  Stool filled colon without distension.  No free air or  free fluid in the abdomen.  Pelvis:  Surgical clips in the anterior pelvis consistent with mesh.  Bladder wall is not thickened.  No free or loculated pelvic fluid collections.  No inflammatory changes in the sigmoid colon. The prostate gland is moderately prominent, measuring 4.1 x 4.8 cm. No significant pelvic lymphadenopathy.  The appendix is normal. Degenerative changes in the lumbar spine with normal alignment.  IMPRESSION: No specific acute abnormality is identified that would explain the patient's symptoms.  Incidental note of multiple small low attenuation lesions in the liver which are too small to characterize.  Consider follow-up with MRI if there is suspicion of metastatic disease.  Original Report Authenticated By: Marlon Pel, M.D.      Pt counseled on  Results.  Pt advised to follow up with his Md for recheck on Monday.        Lonia Skinner [place], [place] 03/04/12 2243

## 2012-03-04 NOTE — Discharge Instructions (Signed)

## 2012-03-04 NOTE — ED Notes (Signed)
Pt describes sharp low abd pain onset this a.m. With diaphoresis. Denies other s/s. Hx diverticulosis and diverticulitis "but not same"

## 2012-03-04 NOTE — ED Notes (Signed)
Patient transported to CT 

## 2012-03-05 ENCOUNTER — Telehealth: Payer: Self-pay | Admitting: Family Medicine

## 2012-03-05 NOTE — ED Provider Notes (Signed)
History/physical exam/procedure(s) were performed by non-physician practitioner and as supervising physician I was immediately available for consultation/collaboration. I have reviewed all notes and am in agreement with care and plan.   Zelta Enfield S Naquita Nappier, MD 03/05/12 1943 

## 2012-03-05 NOTE — Telephone Encounter (Signed)
Spoke to pt who reports improvement in abdominal pain. Pt aware of study results. Will call clinic if condition worsens or develops new symptoms.

## 2012-03-15 ENCOUNTER — Encounter (HOSPITAL_COMMUNITY): Payer: Self-pay | Admitting: Emergency Medicine

## 2012-03-15 ENCOUNTER — Emergency Department (HOSPITAL_COMMUNITY): Payer: Medicare Other

## 2012-03-15 ENCOUNTER — Emergency Department (HOSPITAL_COMMUNITY)
Admission: EM | Admit: 2012-03-15 | Discharge: 2012-03-15 | Disposition: A | Discharge status: Home / Self Care | Payer: Medicare Other | Attending: Emergency Medicine | Admitting: Emergency Medicine

## 2012-03-15 DIAGNOSIS — R6889 Other general symptoms and signs: Secondary | ICD-10-CM | POA: Insufficient documentation

## 2012-03-15 DIAGNOSIS — R059 Cough, unspecified: Secondary | ICD-10-CM

## 2012-03-15 DIAGNOSIS — R6883 Chills (without fever): Secondary | ICD-10-CM | POA: Insufficient documentation

## 2012-03-15 DIAGNOSIS — R61 Generalized hyperhidrosis: Secondary | ICD-10-CM | POA: Insufficient documentation

## 2012-03-15 DIAGNOSIS — I10 Essential (primary) hypertension: Secondary | ICD-10-CM | POA: Insufficient documentation

## 2012-03-15 DIAGNOSIS — J3489 Other specified disorders of nose and nasal sinuses: Secondary | ICD-10-CM | POA: Insufficient documentation

## 2012-03-15 DIAGNOSIS — J329 Chronic sinusitis, unspecified: Secondary | ICD-10-CM

## 2012-03-15 DIAGNOSIS — R51 Headache: Secondary | ICD-10-CM | POA: Insufficient documentation

## 2012-03-15 DIAGNOSIS — R05 Cough: Secondary | ICD-10-CM | POA: Insufficient documentation

## 2012-03-15 MED ORDER — ALBUTEROL SULFATE (5 MG/ML) 0.5% IN NEBU
5.0000 mg | INHALATION_SOLUTION | Freq: Once | RESPIRATORY_TRACT | Status: AC
  Administered 2012-03-15: 5 mg via RESPIRATORY_TRACT
  Filled 2012-03-15: qty 1

## 2012-03-15 MED ORDER — IPRATROPIUM BROMIDE 0.02 % IN SOLN
0.5000 mg | Freq: Once | RESPIRATORY_TRACT | Status: AC
  Administered 2012-03-15: 0.5 mg via RESPIRATORY_TRACT
  Filled 2012-03-15: qty 2.5

## 2012-03-15 MED ORDER — DEXAMETHASONE SODIUM PHOSPHATE 10 MG/ML IJ SOLN
10.0000 mg | Freq: Once | INTRAMUSCULAR | Status: AC
  Administered 2012-03-15: 10 mg via INTRAMUSCULAR
  Filled 2012-03-15: qty 1

## 2012-03-15 MED ORDER — HYDROCOD POLST-CHLORPHEN POLST 10-8 MG/5ML PO LQCR
5.0000 mL | Freq: Once | ORAL | Status: AC
  Administered 2012-03-15: 5 mL via ORAL
  Filled 2012-03-15: qty 5

## 2012-03-15 MED ORDER — ALBUTEROL SULFATE HFA 108 (90 BASE) MCG/ACT IN AERS: 2.0000 | INHALATION_SPRAY | Freq: Once | RESPIRATORY_TRACT | Status: DC

## 2012-03-15 NOTE — ED Provider Notes (Signed)
History     CSN: [id number]  Arrival date & time 03/15/12  4098   First MD Initiated Contact with Patient 03/15/12 [phone]      Chief Complaint  Patient presents with  . Cough  . URI    (Consider location/radiation/quality/duration/timing/severity/associated sxs/prior treatment) Patient is a 51 y.o. male presenting with cough. The history is provided by the patient.  Cough This is a new problem. The current episode started more than 1 week ago. The problem occurs constantly. The problem has not changed since onset.The cough is productive of sputum. There has been no fever. Associated symptoms include chills, sweats, ear congestion, headaches, rhinorrhea, sore throat, myalgias and shortness of breath. Pertinent negatives include no chest pain, no ear pain and no wheezing. He has tried decongestants and cough syrup for the symptoms. He is not a smoker.  PT states he has had sneezing, nasal congestion, cough for almost 2 months now. Has been [facility] now, Has been on 3 different antibiotics over the course of last two months, including on one right now. Take flonase for congestion, allegra. Take supplements. States has had a chest x-ray and CT angio two weeks go, both negative. States symptoms worsened in the last 2 days, unable to sleep.   Past Medical History  Diagnosis Date  . Right leg DVT august 2012  . Chronic back pain   . History of depression   . Diverticulosis 2006  . History of pneumothorax   . Hypertension   . Lipoma     Past Surgical History  Procedure Date  . Spinal steroid injections   . Chest tube insertion     for pneumothorax  . Inguinal hernia repair 2002, 2003    x2  . Cystectomy 2010, 2011  . Cholecystectomy 2005  . Lipoma excision 10/28/2011    Procedure: MINOR EXCISION LIPOMA;  Surgeon: Emelia Loron, MD;  Location: [facility];  Service: General;  Laterality: N/A;  2 back lipoma excison  1 x 2 cm subcutaneous, 3 x4 cm  subcutaneous    Family History  Problem Relation Age of Onset  . Stroke Father   . Heart attack Mother   . Heart disease Mother     heart attack    History  Substance Use Topics  . Smoking status: Never Smoker   . Smokeless tobacco: Never Used  . Alcohol Use: No      Review of Systems  Constitutional: Positive for chills and diaphoresis. Negative for fever.  HENT: Positive for sore throat, rhinorrhea, sneezing and sinus pressure. Negative for ear pain, neck pain and neck stiffness.   Eyes: Negative.   Respiratory: Positive for cough and shortness of breath. Negative for wheezing.   Cardiovascular: Negative.  Negative for chest pain.  Gastrointestinal: Negative.   Genitourinary: Negative.   Musculoskeletal: Positive for myalgias.  Skin: Negative.   Neurological: Positive for headaches.    Allergies  Review of patient's allergies indicates no known allergies.  Home Medications   Current Outpatient Rx  Name Route Sig Dispense Refill  . VITAMIN D3 5000 UNITS PO CAPS Oral Take 1 capsule by mouth daily.      . OMEGA 3 1000 MG PO CAPS Oral Take 1 capsule by mouth 2 (two) times daily.      Marland Kitchen OVER THE COUNTER MEDICATION Oral Take 1 tablet by mouth daily. "allergy med" (unknown active ingredient)    . SAW PALMETTO (SERENOA REPENS) 500 MG PO CAPS  Oral Take 500 mg by mouth daily.      Marland Kitchen HYDROCODONE-ACETAMINOPHEN 5-325 MG PO TABS Oral Take 2 tablets by mouth every 4 (four) hours as needed for pain. 10 tablet 0    BP 115/83  Pulse 100  Temp(Src) 97.4 F (36.3 C) (Oral)  Resp 18  SpO2 95%  Physical Exam  Nursing note and vitals reviewed. Constitutional: He is oriented to person, place, and time. He appears well-developed and well-nourished. No distress.  HENT:  Head: Normocephalic and atraumatic.  Right Ear: External ear normal. Tympanic membrane is retracted.  Left Ear: External ear and ear canal normal. Tympanic membrane is retracted.  Nose: Mucosal edema and  rhinorrhea present. Right sinus exhibits maxillary sinus tenderness and frontal sinus tenderness. Left sinus exhibits maxillary sinus tenderness and frontal sinus tenderness.  Mouth/Throat: Uvula is midline, oropharynx is clear and moist and mucous membranes are normal.  Eyes: Conjunctivae are normal.  Neck: Normal range of motion. Neck supple.  Cardiovascular: Normal rate, regular rhythm and normal heart sounds.   Pulmonary/Chest: Effort normal and breath sounds normal. No respiratory distress. He has no wheezes. He has no rales.  Musculoskeletal: He exhibits no edema.  Lymphadenopathy:    He has no cervical adenopathy.  Neurological: He is alert and oriented to person, place, and time.  Skin: Skin is warm and dry.    ED Course  Procedures (including critical care time)  Pt with perisistent nasal congestion, cough, body aches.  2 days ago stared on levaquin. vS nromal.   Dg Chest 2 View  03/15/2012  *RADIOLOGY REPORT*  Clinical Data: Cough, cold x2 months.  Sweating and back pain.  CHEST - 2 VIEW  Comparison: Chest CT and radiographs 08/22/2011  Findings: The heart size and mediastinal contours are normal. The lungs are clear. There is no pleural effusion or pneumothorax. No acute osseous findings are identified.  IMPRESSION: Stable examination.  No active cardiopulmonary process.  Original Report Authenticated By: Gerrianne Scale, M.D.   9:21 AM CXR negative. Pt given nebs, decadron IM, tussionex, with mild improvement in his symptoms. I think pt's symptoms most likely due to a sinus congestion and post nasal drainage. Will add saline washes and netti pot, will start on sudafed. Follow up with ENT.   1. Sinusitis   2. Cough       MDM          Lottie Mussel, PA 03/15/12 1526

## 2012-03-15 NOTE — ED Provider Notes (Signed)
Medical screening examination/treatment/procedure(s) were performed by non-physician practitioner and as supervising physician I was immediately available for consultation/collaboration.   Joya Gaskins, MD 03/15/12 [phone]

## 2012-03-15 NOTE — ED Notes (Signed)
Pt c/o productive cough x several days with green sputum; pt c/o body sweats and body aches; pt sts sinus infection with sinus pressure; pt sts has had some sx x 2 months and has had a chest CT for same

## 2012-03-15 NOTE — Discharge Instructions (Signed)
I think your symptoms are caused by a sinus congestion. Continue antibiotic and flonase. Add saline nasal spray every 2 hrs, or netti pot. Start sudafed 30mg  every 4-6 hrs. Take albuterol inhaler 2 puffs every 4 hrs. Follow up with your doctor. If your sinus pain worsens, see ENT doctor.   Sinusitis Sinuses are air pockets within the bones of your face. The growth of bacteria within a sinus leads to infection. The infection prevents the sinuses from draining. This infection is called sinusitis. SYMPTOMS  There will be different areas of pain depending on which sinuses have become infected.  The maxillary sinuses often produce pain beneath the eyes.   Frontal sinusitis may cause pain in the middle of the forehead and above the eyes.  Other problems (symptoms) include:  Toothaches.   Colored, pus-like (purulent) drainage from the nose.   Swelling, warmth, and tenderness over the sinus areas may be signs of infection.  TREATMENT  Sinusitis is most often determined by an exam.X-rays may be taken. If x-rays have been taken, make sure you obtain your results or find out how you are to obtain them. Your caregiver may give you medications (antibiotics). These are medications that will help kill the bacteria causing the infection. You may also be given a medication (decongestant) that helps to reduce sinus swelling.  HOME CARE INSTRUCTIONS   Only take over-the-counter or prescription medicines for pain, discomfort, or fever as directed by your caregiver.   Drink extra fluids. Fluids help thin the mucus so your sinuses can drain more easily.   Applying either moist heat or ice packs to the sinus areas may help relieve discomfort.   Use saline nasal sprays to help moisten your sinuses. The sprays can be found at your local drugstore.  SEEK IMMEDIATE MEDICAL CARE IF:  You have a fever.   You have increasing pain, severe headaches, or toothache.   You have nausea, vomiting, or drowsiness.    You develop unusual swelling around the face or trouble seeing.  MAKE SURE YOU:   Understand these instructions.   Will watch your condition.   Will get help right away if you are not doing well or get worse.  Document Released: 10/11/2005 Document Revised: 09/30/2011 Document Reviewed: 05/10/2007 [facility] Patient Information 2012 [place], [place].

## 2012-03-27 ENCOUNTER — Ambulatory Visit (INDEPENDENT_AMBULATORY_CARE_PROVIDER_SITE_OTHER): Payer: Medicare Other | Admitting: Pulmonary Disease

## 2012-03-27 ENCOUNTER — Encounter: Payer: Self-pay | Admitting: Pulmonary Disease

## 2012-03-27 VITALS — BP 100/78 | HR 76 | Temp 97.8°F | Ht 71.0 in | Wt 207.4 lb

## 2012-03-27 DIAGNOSIS — R059 Cough, unspecified: Secondary | ICD-10-CM

## 2012-03-27 DIAGNOSIS — R053 Chronic cough: Secondary | ICD-10-CM | POA: Insufficient documentation

## 2012-03-27 DIAGNOSIS — R05 Cough: Secondary | ICD-10-CM

## 2012-03-27 NOTE — Progress Notes (Signed)
  Subjective:    Patient ID: Zachary [name], male    DOB: 1960/12/06, 51 y.o.   MRN: [id number]  HPI The patient is a 51 year old male who been asked to see for chronic cough.  The patient states he has had a cough for 3 months that is dry in nature, and started out with what sounds like an acute sinusitis.  He had sinus congestion and pressure, as well as postnasal drip with a cough productive of purulent mucus.  He was treated with a short course of antibiotics, has been taking an antihistamine, and has been using sinus rinse is daily.  Despite this, the cough has continued.  He recently went to the emergency room the end of last month with chills and sweats, and his chest x-ray was totally clear.  The patient is continuing to have significant postnasal drip and pressure, and is getting purulent material out of his nose when using saline rinses.  He denies any reflux symptoms although he does have a history of a small hiatal hernia.  He denies any chest congestion or shortness of breath.  He has no history of asthma.   Review of Systems  Constitutional: Negative.  Negative for fever and unexpected weight change.  HENT: Positive for postnasal drip. Negative for ear pain, nosebleeds, congestion, sore throat, rhinorrhea, sneezing, trouble swallowing, dental problem and sinus pressure.   Eyes: Negative.  Negative for redness and itching.  Respiratory: Positive for cough. Negative for chest tightness, shortness of breath and wheezing.   Cardiovascular: Negative.  Negative for palpitations and leg swelling.  Gastrointestinal: Negative.  Negative for nausea and vomiting.  Genitourinary: Negative.  Negative for dysuria.  Musculoskeletal: Negative.  Negative for joint swelling.  Skin: Negative.  Negative for rash.  Neurological: Positive for headaches.  Hematological: Negative.  Does not bruise/bleed easily.  Psychiatric/Behavioral: Negative for dysphoric mood. The patient is nervous/anxious.       Objective:   Physical Exam Constitutional:  Well developed, no acute distress  HENT:  Nares patent, but +erythematous mucosa and +purulence on left  Oropharynx without exudate, palate and uvula are normal  Eyes:  Perrla, eomi, no scleral icterus  Neck:  No JVD, no TMG  Cardiovascular:  Normal rate, regular rhythm, no rubs or gallops.  No murmurs        Intact distal pulses  Pulmonary :  Normal breath sounds, no stridor or respiratory distress   No rales, rhonchi, or wheezing  Abdominal:  Soft, nondistended, bowel sounds present.  No tenderness noted.   Musculoskeletal:  No lower extremity edema noted.  Lymph Nodes:  No cervical lymphadenopathy noted  Skin:  No cyanosis noted  Neurologic:  Alert, appropriate, moves all 4 extremities without obvious deficit.         Assessment & Plan:

## 2012-03-27 NOTE — Assessment & Plan Note (Signed)
The patient has a chronic cough of 3 months duration that is clearly upper airway in origin than lower.  His history is very suggestive of chronic sinusitis, and he has not responded well to antibiotics and aggressive nasal hygiene regimen.  I think he needs a scan of his sinuses to rule out persistent sinusitis, and if so, will need a prolonged course of antibiotics.  If his sinus scan is unremarkable, would add treatment for laryngopharyngeal reflux and continue behavioral therapies to limit cyclical coughing.

## 2012-03-27 NOTE — Patient Instructions (Signed)
Continue sinus rinses am and pm Will check a scan of your sinuses to see if still infected, and will call you with results. Stop allegra, and start taking chlorpheniramine over the counter 8mg  at bedtime and 4mg  at lunch Change your nasal spray to 2 sprays am and pm only. Stop fish oil until your cough is gone. If your scan of your sinuses is normal, will add medication for possible reflux. No throat clearing, yelling, singing. Keep hard candy (fruit flavored lifesavers, werthers, butterscotch, jolly ranchers) in your mouth all day to help with tickle in throat.  No mint or menthol cough drops.

## 2012-03-30 ENCOUNTER — Ambulatory Visit (INDEPENDENT_AMBULATORY_CARE_PROVIDER_SITE_OTHER)
Admission: RE | Admit: 2012-03-30 | Discharge: 2012-03-30 | Disposition: A | Discharge status: Home / Self Care | Payer: Medicare Other | Source: Ambulatory Visit | Attending: Pulmonary Disease | Admitting: Pulmonary Disease

## 2012-03-30 DIAGNOSIS — R05 Cough: Secondary | ICD-10-CM

## 2012-03-30 DIAGNOSIS — R053 Chronic cough: Secondary | ICD-10-CM

## 2012-03-30 DIAGNOSIS — R059 Cough, unspecified: Secondary | ICD-10-CM

## 2012-04-12 ENCOUNTER — Telehealth: Payer: Self-pay | Admitting: Pulmonary Disease

## 2012-04-12 MED ORDER — OMEPRAZOLE 40 MG PO CPDR: 40.0000 mg | DELAYED_RELEASE_CAPSULE | Freq: Two times a day (BID) | ORAL | Status: DC

## 2012-04-12 NOTE — Progress Notes (Signed)
Quick Note:  Pt aware of results and Rx sent. Pt to call back in 2 weeks with update for [facility]. ______

## 2012-04-12 NOTE — Telephone Encounter (Signed)
Notes Recorded by Barbaraann Share, MD on 04/04/2012 at 5:05 PM Let pt know that his sinus ct showed some mild thickening, but not overly impressive. I would like him to continue with everything we discussed at last visit, and will add something for acid reflux. Please call in omeprazole 40mg  one in am AND pm for next 2 weeks. Have him call us in 2 weeks with update on progress of cough. #30, one fill.  Pt is aware of results and Rx sent. Pt to call back in 2 weeks with update for [facility].

## 2012-04-17 ENCOUNTER — Telehealth: Payer: Self-pay | Admitting: Pulmonary Disease

## 2012-04-17 NOTE — Telephone Encounter (Signed)
I spoke with pt and he states the pharmacy had his omerpazole rx was on hold. I called [facility] to see what is going on. I spoke with the pharmacy and they just needed to verify the rx sent to them. They are going to go ahead and fill this for pt now. I advised pt of this and nothing further was needed

## 2012-04-19 ENCOUNTER — Telehealth: Payer: Self-pay | Admitting: *Deleted

## 2012-04-19 NOTE — Telephone Encounter (Signed)
Received PA on pt's omeprazole 40 mg. I called to intiate PA. [employer] is going to fax form over for [facility] to fill out and fax back. Will await form 

## 2012-04-19 NOTE — Telephone Encounter (Signed)
Received PA on pt's omeprazole 40 mg. I called to intiate PA. [employer] is going to fax form over for [facility] to fill out and fax back. Will await form

## 2012-04-19 NOTE — Telephone Encounter (Signed)
Have received form and placed in [facility] look at. Please advise [facility] thanks

## 2012-04-21 ENCOUNTER — Telehealth: Payer: Self-pay | Admitting: Pulmonary Disease

## 2012-04-21 NOTE — Telephone Encounter (Addendum)
Spoke with pt and pt and he states wants to know why he can not pick up omeprazole. I advised that med requires PA and we are in the process of working on this. I advised we will let him know the outcome. Pt verbalized understanding and states nothing further needed.

## 2012-04-21 NOTE — Telephone Encounter (Signed)
Done and put on Lori's desk.

## 2012-04-24 NOTE — Telephone Encounter (Signed)
Form has been faxed. Will hold msg to await response re: coverage.

## 2012-04-26 NOTE — Telephone Encounter (Signed)
Omeprazole has been APPROVED through [facility] and is good through 10/21/2012.Marland Kitchen Approval sent for scanning.

## 2012-06-02 ENCOUNTER — Encounter: Payer: Self-pay | Admitting: Family Medicine

## 2012-06-02 ENCOUNTER — Ambulatory Visit (HOSPITAL_COMMUNITY)
Admission: RE | Admit: 2012-06-02 | Discharge: 2012-06-02 | Disposition: A | Discharge status: Home / Self Care | Payer: Medicare Other | Source: Ambulatory Visit | Attending: Family Medicine | Admitting: Family Medicine

## 2012-06-02 ENCOUNTER — Ambulatory Visit (INDEPENDENT_AMBULATORY_CARE_PROVIDER_SITE_OTHER): Payer: Medicare Other | Admitting: Family Medicine

## 2012-06-02 VITALS — BP 114/78 | HR 65 | Temp 97.5°F | Ht 71.0 in | Wt 213.6 lb

## 2012-06-02 DIAGNOSIS — M79609 Pain in unspecified limb: Secondary | ICD-10-CM | POA: Insufficient documentation

## 2012-06-02 DIAGNOSIS — M79669 Pain in unspecified lower leg: Secondary | ICD-10-CM

## 2012-06-02 MED ORDER — RIVAROXABAN 15 MG PO TABS: 15.0000 mg | ORAL_TABLET | Freq: Two times a day (BID) | ORAL | Status: DC

## 2012-06-02 NOTE — Assessment & Plan Note (Addendum)
Right calf which is where he had previous DVT  Symptoms are atypical for DVT but he is at high risk due to previous DVT. Given Written Rx for xaralto to be filled only if he has positive dopplers.

## 2012-06-02 NOTE — Progress Notes (Signed)
  Subjective:    Patient ID: Zachary [name], male    DOB: 15-Aug-1961, 51 y.o.   MRN: [id number]  HPI Has symptoms of right calf numbness similar to the feelings he had with DVT one year ago.  He has no swelling, pain.  No recent trauma or travel.  No recent surg.  Previous DVT was unprovoked.  Denies chest pressure or SOB    Review of Systems     Objective:   Physical Exam Normal leg with no edema, normal skin color and pulses Lungs clears       Assessment & Plan:

## 2012-06-02 NOTE — Progress Notes (Signed)
Right:  No evidence of DVT, superficial thrombosis, or Baker's cyst.  Left:  Negative for DVT in the common femoral vein.  

## 2012-06-02 NOTE — Patient Instructions (Signed)
Get your ultrasound today. If your ultrasound shows no blood clot - No treatment.  Tear up the prescription If your ultrasound shows a blood clot, fill the prescription right away. The dose will change in 3 weeks to 20 mg once a day.

## 2012-06-05 ENCOUNTER — Other Ambulatory Visit: Payer: Self-pay | Admitting: Family Medicine

## 2012-07-02 ENCOUNTER — Emergency Department (HOSPITAL_COMMUNITY)
Admission: EM | Admit: 2012-07-02 | Discharge: 2012-07-02 | Disposition: A | Discharge status: Home / Self Care | Payer: Medicare Other | Attending: Emergency Medicine | Admitting: Emergency Medicine

## 2012-07-02 ENCOUNTER — Emergency Department (HOSPITAL_COMMUNITY): Payer: Medicare Other

## 2012-07-02 ENCOUNTER — Encounter (HOSPITAL_COMMUNITY): Payer: Self-pay | Admitting: *Deleted

## 2012-07-02 ENCOUNTER — Telehealth: Payer: Self-pay | Admitting: Internal Medicine

## 2012-07-02 DIAGNOSIS — Z9089 Acquired absence of other organs: Secondary | ICD-10-CM | POA: Insufficient documentation

## 2012-07-02 DIAGNOSIS — I10 Essential (primary) hypertension: Secondary | ICD-10-CM | POA: Insufficient documentation

## 2012-07-02 DIAGNOSIS — G8929 Other chronic pain: Secondary | ICD-10-CM | POA: Insufficient documentation

## 2012-07-02 DIAGNOSIS — Z86718 Personal history of other venous thrombosis and embolism: Secondary | ICD-10-CM | POA: Insufficient documentation

## 2012-07-02 DIAGNOSIS — Z8719 Personal history of other diseases of the digestive system: Secondary | ICD-10-CM

## 2012-07-02 DIAGNOSIS — R109 Unspecified abdominal pain: Secondary | ICD-10-CM | POA: Insufficient documentation

## 2012-07-02 LAB — BASIC METABOLIC PANEL
CO2: 26 mEq/L (ref 19–32)
Chloride: 102 mEq/L (ref 96–112)
Creatinine, Ser: 0.91 mg/dL (ref 0.50–1.35)
Sodium: 138 mEq/L (ref 135–145)

## 2012-07-02 LAB — URINALYSIS, ROUTINE W REFLEX MICROSCOPIC
Glucose, UA: NEGATIVE mg/dL
Leukocytes, UA: NEGATIVE
Specific Gravity, Urine: 1.016 (ref 1.005–1.030)
pH: 5.5 (ref 5.0–8.0)

## 2012-07-02 LAB — CBC WITH DIFFERENTIAL/PLATELET
Basophils Absolute: 0 10*3/uL (ref 0.0–0.1)
HCT: 43.9 % (ref 39.0–52.0)
Lymphocytes Relative: 36 % (ref 12–46)
Monocytes Absolute: 0.5 10*3/uL (ref 0.1–1.0)
Neutro Abs: 3.6 10*3/uL (ref 1.7–7.7)
RDW: 13.5 % (ref 11.5–15.5)
WBC: 6.8 10*3/uL (ref 4.0–10.5)

## 2012-07-02 MED ORDER — CIPROFLOXACIN HCL 500 MG PO TABS: 500.0000 mg | ORAL_TABLET | Freq: Two times a day (BID) | ORAL | Status: DC

## 2012-07-02 MED ORDER — KETOROLAC TROMETHAMINE 30 MG/ML IJ SOLN
30.0000 mg | Freq: Once | INTRAMUSCULAR | Status: AC
  Administered 2012-07-02: 30 mg via INTRAVENOUS
  Filled 2012-07-02: qty 1

## 2012-07-02 MED ORDER — METRONIDAZOLE 500 MG PO TABS: 500.0000 mg | ORAL_TABLET | Freq: Three times a day (TID) | ORAL | Status: DC

## 2012-07-02 MED ORDER — MORPHINE SULFATE 4 MG/ML IJ SOLN
4.0000 mg | Freq: Once | INTRAMUSCULAR | Status: DC
  Filled 2012-07-02: qty 1

## 2012-07-02 MED ORDER — TRAMADOL HCL 50 MG PO TABS: 50.0000 mg | ORAL_TABLET | Freq: Four times a day (QID) | ORAL | Status: DC | PRN

## 2012-07-02 MED ORDER — SODIUM CHLORIDE 0.9 % IV SOLN
Freq: Once | INTRAVENOUS | Status: AC
  Administered 2012-07-02: 125 mL/h via INTRAVENOUS

## 2012-07-02 NOTE — ED Notes (Signed)
Shari PAC is  at bedside.

## 2012-07-02 NOTE — ED Notes (Addendum)
Pt states his recent bowel movements have been normal. Denies seeing any blood or tarry stools. Pain is more significant on the lower right side. Pain does not radiate outside of lower abdominal quadrants.

## 2012-07-02 NOTE — ED Provider Notes (Signed)
Medical screening examination/treatment/procedure(s) were conducted as a shared visit with non-physician practitioner(s) and myself.  I personally evaluated the patient during the encounter  Pt states diverticulosis with possibly one spell diverticulitis in past, now few days diffuse lower abd pain, was RLQ earlier today now LLQ and suprapubic mildly tender and RLQ NT no rebound, ?mild diverticulitis? Do not feel needs CT and Pt agrees.  Hurman Horn, MD 07/06/12 1700

## 2012-07-02 NOTE — ED Provider Notes (Signed)
History     CSN: [id number]  Arrival date & time 07/02/12  0555   First MD Initiated Contact with Patient 07/02/12 0701      Chief Complaint  Patient presents with  . Abdominal Pain    (Consider location/radiation/quality/duration/timing/severity/associated sxs/prior treatment) Patient is a 51 y.o. male presenting with abdominal pain. The history is provided by the patient.  Abdominal Pain The primary symptoms of the illness include abdominal pain. The primary symptoms of the illness do not include fever, shortness of breath, nausea, vomiting, diarrhea or dysuria. The current episode started more than 2 days ago. The onset of the illness was gradual. The problem has been gradually worsening.  Symptoms associated with the illness do not include hematuria. Associated symptoms comments: Lower right abdominal pain for the past 3-4 days that has become progressively worse. No N, V. He denies groin pain or dysuria. No flank pain or hematuria. No fever or diarrhea and he has a normal appetite. He states he has had similar pain in the past that resolved spontaneously without definitive diagnosis but reports a negative CT scan at that time, which was about one year ago.. Associated medical issues comments: He has history of diverticulitis requiring bowel resection. .    Past Medical History  Diagnosis Date  . Right leg DVT august 2012  . Chronic back pain   . History of depression   . Diverticulosis 2006  . History of pneumothorax   . Hypertension   . Lipoma     Past Surgical History  Procedure Date  . Spinal steroid injections   . Chest tube insertion     for pneumothorax  . Inguinal hernia repair 2002, 2003    x2  . Cystectomy 2010, 2011  . Cholecystectomy 2005  . Lipoma excision 10/28/2011    Procedure: MINOR EXCISION LIPOMA;  Surgeon: Emelia Loron, MD;  Location: [facility];  Service: General;  Laterality: N/A;  2 back lipoma excison  1 x 2 cm subcutaneous, 3 x4  cm subcutaneous    Family History  Problem Relation Age of Onset  . Stroke Father   . Heart attack Mother   . Heart disease Mother     heart attack    History  Substance Use Topics  . Smoking status: Never Smoker   . Smokeless tobacco: Never Used  . Alcohol Use: No      Review of Systems  Constitutional: Negative for fever.  Respiratory: Negative for shortness of breath.   Cardiovascular: Negative for chest pain.  Gastrointestinal: Positive for abdominal pain. Negative for nausea, vomiting and diarrhea.  Genitourinary: Negative for dysuria, hematuria, flank pain and testicular pain.    Allergies  Review of patient's allergies indicates no known allergies.  Home Medications   Current Outpatient Rx  Name Route Sig Dispense Refill  . OMEGA 3 1000 MG PO CAPS Oral Take 1 capsule by mouth 2 (two) times daily.      . SAW PALMETTO (SERENOA REPENS) 500 MG PO CAPS Oral Take 500 mg by mouth daily.        BP 117/86  Pulse 65  Temp 97 F (36.1 C) (Oral)  Resp 16  SpO2 96%  Physical Exam  Constitutional: He is oriented to person, place, and time. He appears well-developed and well-nourished. No distress.  HENT:  Head: Normocephalic.  Neck: Normal range of motion. Neck supple.  Cardiovascular: Normal rate and regular rhythm.   Pulmonary/Chest: Effort normal and breath sounds normal.  Abdominal: Soft.  Bowel sounds are normal. There is tenderness. There is no rebound and no guarding.       RLQ  Abdomen tender without guarding or rebound tenderness. No mass.   Musculoskeletal: Normal range of motion.  Neurological: He is alert and oriented to person, place, and time.  Skin: Skin is warm and dry. No rash noted.  Psychiatric: He has a normal mood and affect.    ED Course  Procedures (including critical care time)   Labs Reviewed  CBC WITH DIFFERENTIAL  BASIC METABOLIC PANEL  URINALYSIS, ROUTINE W REFLEX MICROSCOPIC   Results for orders placed during the hospital  encounter of 07/02/12  CBC WITH DIFFERENTIAL      Component Value Range   WBC 6.8  4.0 - 10.5 K/uL   RBC 4.97  4.22 - 5.81 MIL/uL   Hemoglobin 15.4  13.0 - 17.0 g/dL   HCT 47.8  29.5 - 62.1 %   MCV 88.3  78.0 - 100.0 fL   MCH 31.0  26.0 - 34.0 pg   MCHC 35.1  30.0 - 36.0 g/dL   RDW 30.8  65.7 - 84.6 %   Platelets 218  150 - 400 K/uL   Neutrophils Relative 53  43 - 77 %   Neutro Abs 3.6  1.7 - 7.7 K/uL   Lymphocytes Relative 36  12 - 46 %   Lymphs Abs 2.4  0.7 - 4.0 K/uL   Monocytes Relative 8  3 - 12 %   Monocytes Absolute 0.5  0.1 - 1.0 K/uL   Eosinophils Relative 3  0 - 5 %   Eosinophils Absolute 0.2  0.0 - 0.7 K/uL   Basophils Relative 1  0 - 1 %   Basophils Absolute 0.0  0.0 - 0.1 K/uL  BASIC METABOLIC PANEL      Component Value Range   Sodium 138  135 - 145 mEq/L   Potassium 4.2  3.5 - 5.1 mEq/L   Chloride 102  96 - 112 mEq/L   CO2 26  19 - 32 mEq/L   Glucose, Bld 107 (*) 70 - 99 mg/dL   BUN 16  6 - 23 mg/dL   Creatinine, Ser 9.62  0.50 - 1.35 mg/dL   Calcium 9.2  8.4 - 95.2 mg/dL   GFR calc non Af Amer >90  >90 mL/min   GFR calc Af Amer >90  >90 mL/min  URINALYSIS, ROUTINE W REFLEX MICROSCOPIC      Component Value Range   Color, Urine YELLOW  YELLOW   APPearance CLEAR  CLEAR   Specific Gravity, Urine 1.016  1.005 - 1.030   pH 5.5  5.0 - 8.0   Glucose, UA NEGATIVE  NEGATIVE mg/dL   Hgb urine dipstick NEGATIVE  NEGATIVE   Bilirubin Urine NEGATIVE  NEGATIVE   Ketones, ur NEGATIVE  NEGATIVE mg/dL   Protein, ur NEGATIVE  NEGATIVE mg/dL   Urobilinogen, UA 0.2  0.0 - 1.0 mg/dL   Nitrite NEGATIVE  NEGATIVE   Leukocytes, UA NEGATIVE  NEGATIVE   Dg Abd Acute W/chest  07/02/2012  *RADIOLOGY REPORT*  Clinical Data: Abdominal pain.  ACUTE ABDOMEN SERIES (ABDOMEN 2 VIEW & CHEST 1 VIEW)  Comparison: Chest x-ray 08/22/2011.  Findings: Prominent epicardial fat pad is unchanged. Lung volumes are normal.  No consolidative airspace disease.  No pleural effusions.  No pneumothorax.   No pulmonary nodule or mass noted. Pulmonary vasculature and the cardiomediastinal silhouette are within normal limits.  Mild bilateral apical pleuroparenchymal thickening compatible with  scarring, also unchanged.  Supine and upright views of the abdomen demonstrate gas and stool scattered throughout the colon extending to the level of the distal rectum.  There are several gas filled nondilated loops of small bowel measuring up to approximately 2.8 cm in diameter, which is highly nonspecific.  A few small air fluid levels are noted on the upright projection.  Surgical clips project over the right upper quadrant of the abdomen, compatible with prior cholecystectomy. Markers from a surgical mesh are seen in the lower pelvis bilaterally, likely related to bilateral inguinal herniorrhaphy.  IMPRESSION: 1.  Nonspecific, nonobstructive bowel gas pattern, as above. 2.  No pneumoperitoneum. 3.  Status post cholecystectomy. 4.  No radiographic evidence of acute cardiopulmonary disease.   Original Report Authenticated By: Florencia Reasons, M.D.    No results found.   No diagnosis found.  1. Abdominal pain, h/o diverticulitis   MDM  RLQ abdominal pain like previous benign abdominal pain, for 3-4 days. No other symptoms typical for appendicitis (no fever, diarrhea, anorexia, N, V) and no leukocytosis. He has history of numerous CT scans in the recent past with history of diverticulitis and bowel resection. There are no indications for obstruction, infectious process or surgical abdomen. Feel the patient can avoid an immediate CT scan with close follow up for re-examination tomorrow.   11:00 - Delay in reaching [facility] Resident to arrange follow up appointment/recheck tomorrow. [facility] to arrange to see him tomorrow. Will start on Cipro/Flagyl empirically treating diverticulitis.        Rodena Medin, PA-C 07/02/12 1106

## 2012-07-02 NOTE — ED Notes (Signed)
MD at bedside. 

## 2012-07-02 NOTE — ED Notes (Signed)
Pt states that 4 days ago he started having abdominal pain slowly getting worse. Today he got up and went to the [place] then after walking he started having severe lower abdominal pain, near pelvic area.

## 2012-07-02 NOTE — Telephone Encounter (Signed)
Opened in error

## 2012-07-02 NOTE — Progress Notes (Signed)
Spoke with Zachary Adie PA-C about Zachary [name].  He is being seen today because of lower abdominal pain.  He is afebrile with no WBC count and is not obstructed.  The plan is to treat empirically with Cipro and Flagyl for presumed diverticulitis.  He will need close follow up in the Clinic, preferably tomorrow.  I will send a flag to the front desk and nursing pool to arrange for follow up as soon as possible on Monday.  Zachary [name] Internal Medicine Resident Pager: [phone] 07/02/2012 11:04 AM

## 2012-07-04 ENCOUNTER — Encounter: Payer: Self-pay | Admitting: Family Medicine

## 2012-07-04 ENCOUNTER — Ambulatory Visit (INDEPENDENT_AMBULATORY_CARE_PROVIDER_SITE_OTHER): Payer: Medicare Other | Admitting: Family Medicine

## 2012-07-04 VITALS — BP 111/72 | HR 77 | Temp 98.0°F | Ht 71.0 in | Wt 214.0 lb

## 2012-07-04 DIAGNOSIS — D179 Benign lipomatous neoplasm, unspecified: Secondary | ICD-10-CM

## 2012-07-04 DIAGNOSIS — R109 Unspecified abdominal pain: Secondary | ICD-10-CM | POA: Insufficient documentation

## 2012-07-04 MED ORDER — GABAPENTIN 100 MG PO CAPS: 100.0000 mg | ORAL_CAPSULE | Freq: Two times a day (BID) | ORAL | Status: DC

## 2012-07-04 NOTE — Patient Instructions (Addendum)
Thank you for coming in today I think your pain is related to your surgery in 2006.  Please start taking the gabapentin 100mg  2-3 times daily. After 1 week you may increase to 300mg  twice daily. THis medication may make you tired I will Call Dr. Levert Feinstein and get his recommendations Please come back to see me as needed for your abdominal pain Stop your antibiotics Stop your tramadol. Use ibuprofen or tylenol in its place

## 2012-07-06 ENCOUNTER — Telehealth: Payer: Self-pay | Admitting: Family Medicine

## 2012-07-06 DIAGNOSIS — R109 Unspecified abdominal pain: Secondary | ICD-10-CM

## 2012-07-06 NOTE — Telephone Encounter (Signed)
Called and left a message w/ medical assistant at Dr. Raynelle Fanning office to discuss recommendations.

## 2012-07-06 NOTE — Telephone Encounter (Signed)
Relayed Lab info to pt.  He then states that he would like a referral back to GI MD.  States that he saw an MD in [place] in 2006 and had polyps.  They were removed and came back negative.  States that he would like to go back and see a GI again since he os having this pain.  He would like to see a Dr. Idelle Jo but he has no openings until November.  Wants to know if Dr. Konrad Dolores can recommend someone (he wants someone who has been practicing a long time).  Advise that we would need notes from the MD in [place].  He states that he had Dr. Konrad Dolores review some notes and he was to call the MD in [place] and get notes.  The MDs name was Dr. Levert Feinstein.  Will forward to MD to see if this took place.  Pt informed and agreeable. Brennin Durfee, Maryjo Rochester

## 2012-07-06 NOTE — Telephone Encounter (Signed)
Pt is asking for results and to speak with Dr Konrad Dolores - also needs referral to GI doc - Dr Len Childs

## 2012-07-10 DIAGNOSIS — D179 Benign lipomatous neoplasm, unspecified: Secondary | ICD-10-CM | POA: Insufficient documentation

## 2012-07-10 NOTE — Telephone Encounter (Signed)
Called and spoke to pt to inform of effor tto referr to [facility] dr. Westley Gambles or Darl Householder to Dr. Raynelle Fanning staff in [place] who said recommended repeat scope in 3 years from previous. (08/2009).

## 2012-07-10 NOTE — Progress Notes (Signed)
  Subjective:    Patient ID: Zachary [name], male    DOB: [birth year], 51 y.o.   MRN: [id number]  HPI CC: Abd pain f/u  Abd Pain: evaluated in ED and started on Cipro /Flagyl due to suspected GI infection. Pain is described as a buring pressure. Present ever since bowel surgery in 2006. Last scope Nov 2010. Pain occurs 2-3x yrly and last 5-7 days. No A/A factors. Normal BM and denies any hematochezia, hematemasis, fever, wt loss, n/v/d/c. Tramadol given by ED makes pt very dizzy  Lipoma: Multiple lipomas over chest, trunk, nad legs. Present for several years. Several excised. Two lesions in particular causing pain and discomfort on L chest wall and R posterior thigh.   Review of Systems Per HPI    Objective:   Physical Exam Gen: NAD SKin: Multiple, subcutaneous, soft movable globular lesions over chest trunk and legs. Abd: Soft, non-painful to palpation, no organomegaly, NABS       Assessment & Plan:

## 2012-07-10 NOTE — Assessment & Plan Note (Signed)
Abd pain not likely to be infectious so can stop cipro and flagyl. Likely related to significant bowel surgery hx vs psychosomatic. No evidence of abnormality at this time as ED charts and film reviwed. Will contact Previous GI doctor for recommendations regarding scope. Dr. Levert Feinstein [phone] as last scope in 2010 w/ tubular adenoma hyperplastic polyp.  Pt to DC tramadol Starting Neurontin

## 2012-07-10 NOTE — Assessment & Plan Note (Signed)
Reviewed pathology from previous excision. Different physician handling excisions. L chest lipoma will likely need to be reomoved as it is causing significnt discomfort, as well as lesion on posterior thigh

## 2012-07-11 NOTE — Addendum Note (Signed)
Addended by: Konrad Dolores, Reign Bartnick J on: 07/11/2012 02:51 PM   Modules accepted: Orders

## 2012-07-11 NOTE — Telephone Encounter (Signed)
(  COPY)----- Message from Ozella Rocks, MD sent at 07/10/2012 5:07 PM ----- Please call [facility] and schedule Mr Culton for appt/scope. He was last scoped in 08/2009 and a hyperplastic tubular adenoma was found. Pt is s/p bowel resection and reanastomosis for bowel ruputre in 2006. Would prefer appt w/ Dr. Westley Gambles or Arlyce Dice. As early as possible. Thanks Please call pt w/ appt time. If issues page me   Onalee Hua, Please put in a GI referral and be sure to notate in the referral details all the specifics. [place], Maryjo Rochester

## 2012-07-11 NOTE — Assessment & Plan Note (Signed)
Will refer to [place] for eval adn possible scope.

## 2012-07-30 IMAGING — CR DG CHEST 2V
2 series · 2 of 2 positions shown · non-contrast
Comparison: None.

CLINICAL DATA: Left lateral chest pain, recent DVT/PE

CHEST - 2 VIEW

[w chest pa]
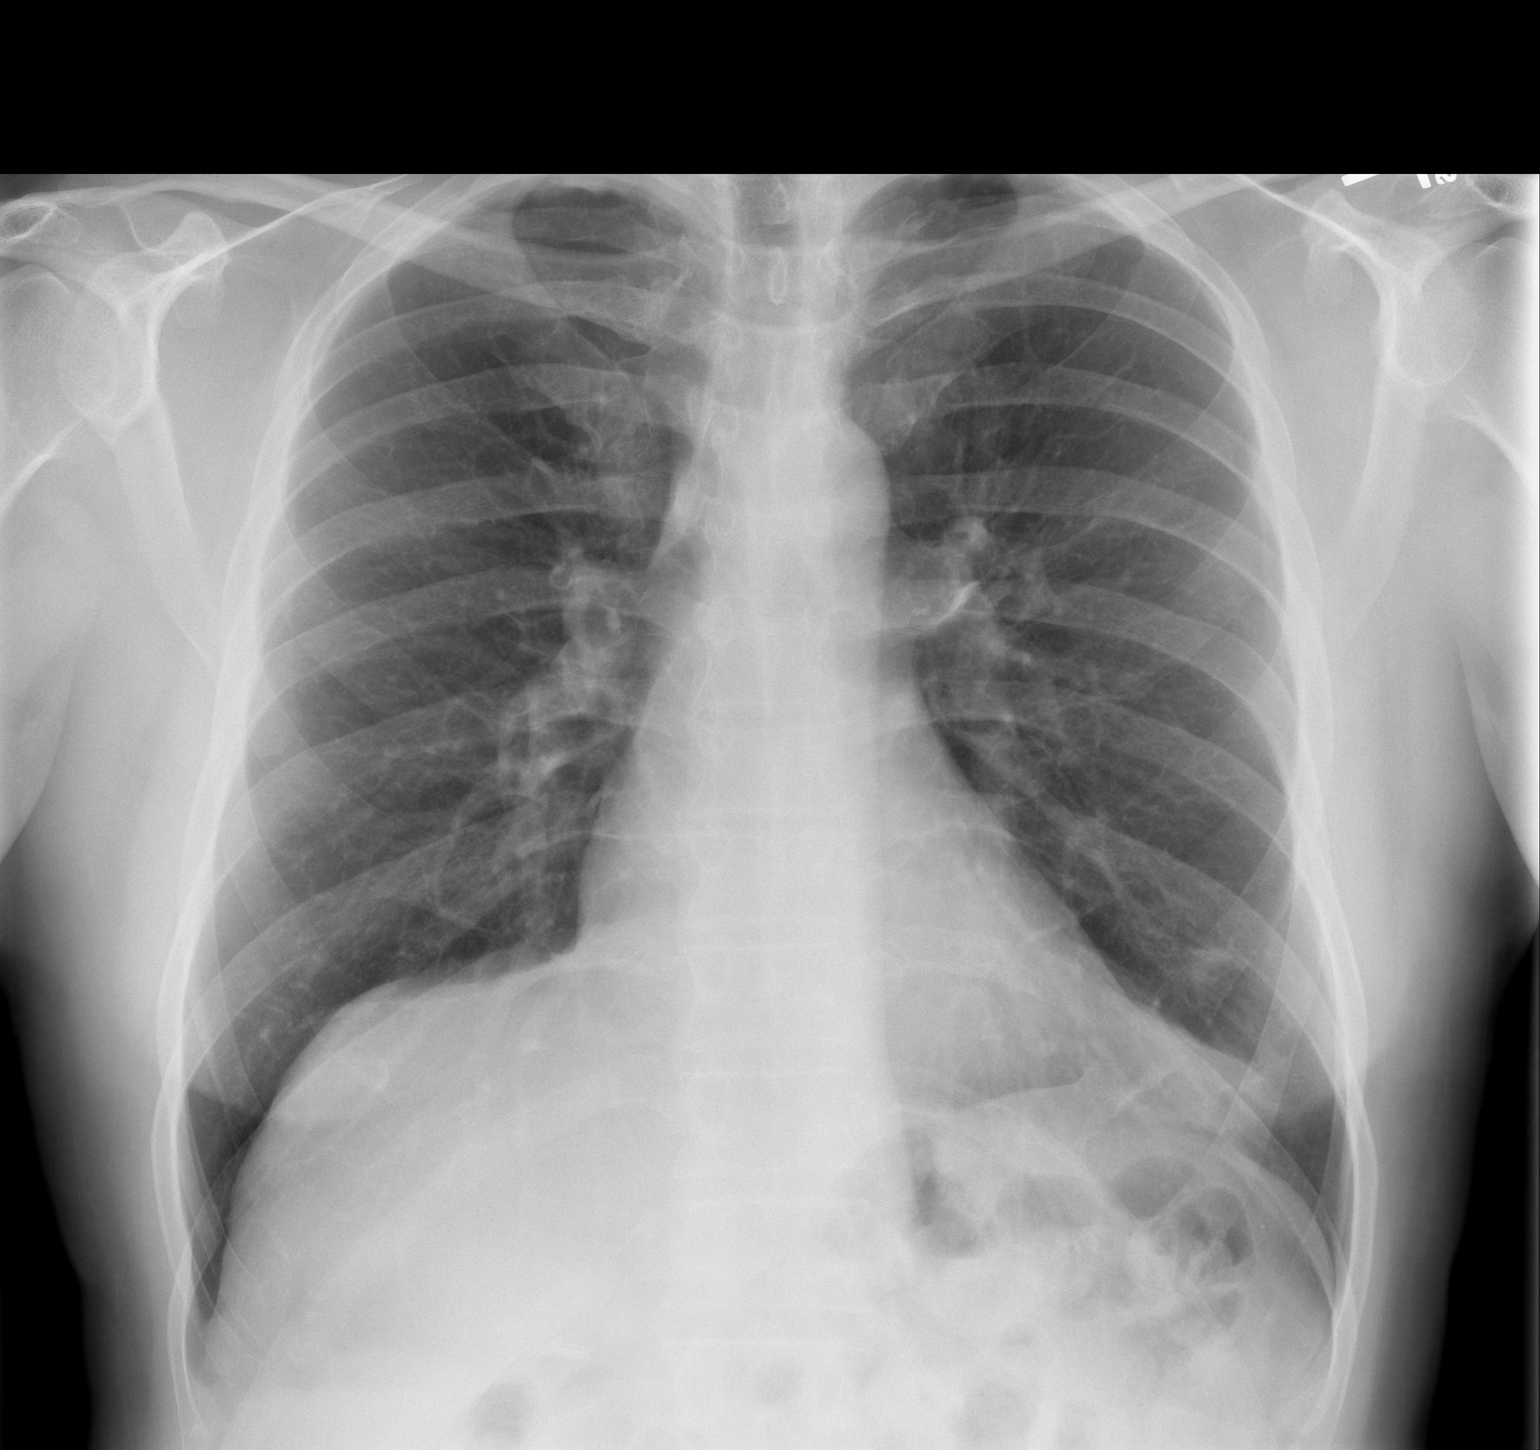

[w chest lat]
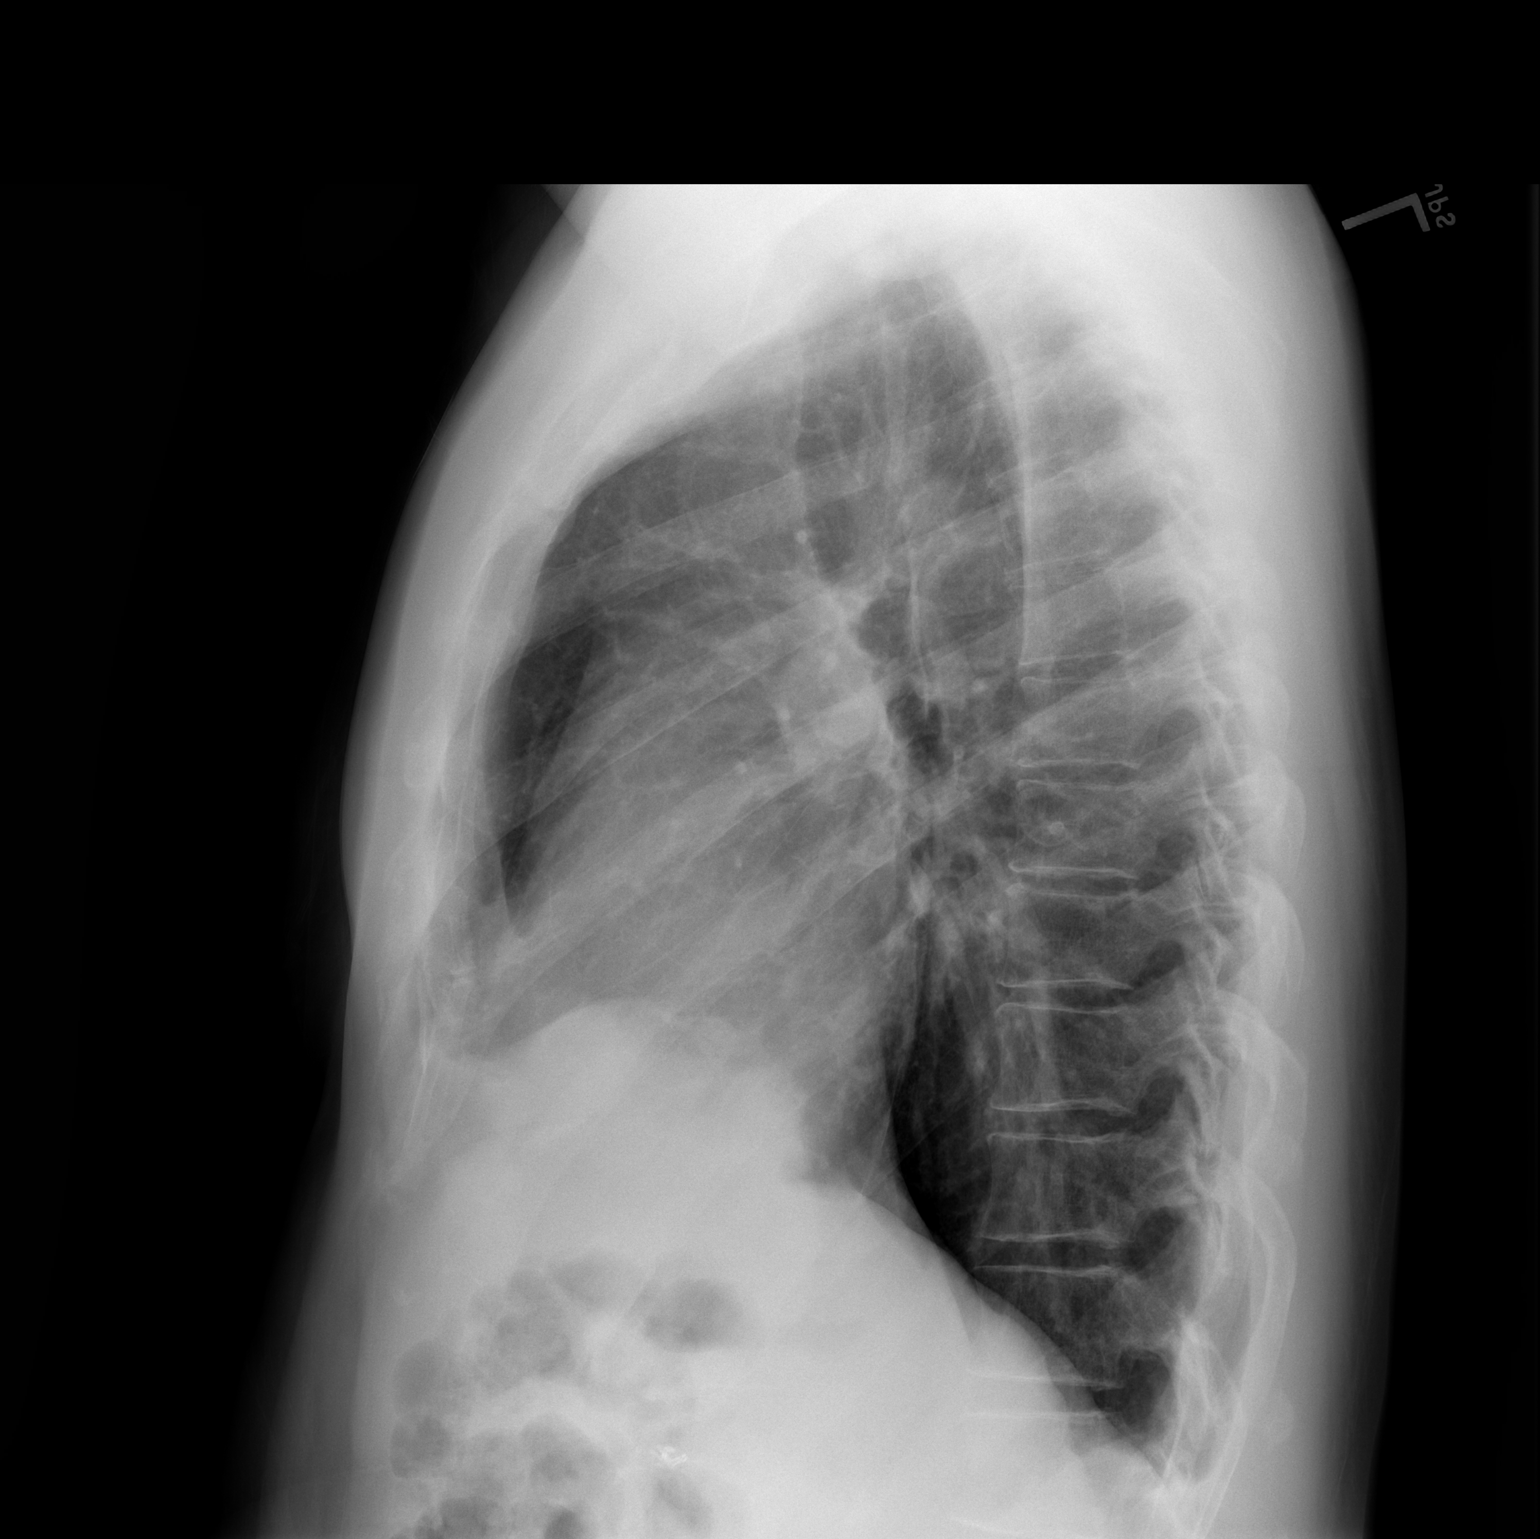

[2 of 2 positions shown; findings below may reference images not displayed]

FINDINGS: Lungs are clear. No pleural effusion or pneumothorax.

Mild eventration of the anterior right hemidiaphragm.

Visualized osseous structures are within normal limits.

Cholecystectomy clips.
IMPRESSION: No evidence of acute cardiopulmonary disease.

## 2012-08-03 ENCOUNTER — Other Ambulatory Visit: Payer: Self-pay | Admitting: *Deleted

## 2012-08-04 ENCOUNTER — Ambulatory Visit (INDEPENDENT_AMBULATORY_CARE_PROVIDER_SITE_OTHER): Payer: Medicare Other | Admitting: General Surgery

## 2012-08-04 ENCOUNTER — Encounter (INDEPENDENT_AMBULATORY_CARE_PROVIDER_SITE_OTHER): Payer: Self-pay | Admitting: General Surgery

## 2012-08-04 VITALS — BP 126/70 | HR 68 | Temp 98.0°F | Resp 18 | Ht 72.0 in | Wt 209.6 lb

## 2012-08-04 DIAGNOSIS — D179 Benign lipomatous neoplasm, unspecified: Secondary | ICD-10-CM

## 2012-08-04 NOTE — Progress Notes (Signed)
Patient ID: Zachary [name], male   DOB: 05-15-1961, 51 y.o.   MRN: [id number]  Chief Complaint  Patient presents with  . Routine Post Op    lipomas    HPI Zachary [name] is a 51 y.o. male.   HPI This is a 51 year old male who I've excised multiple lipomas. He returns today with the complaint of a right posterior thigh lipoma that is making it hard for him to sit while he drives oriented to. He would like this excised. Past Medical History  Diagnosis Date  . Right leg DVT august 2012  . Chronic back pain   . History of depression   . Diverticulosis 2006  . History of pneumothorax   . Hypertension   . Lipoma     Past Surgical History  Procedure Date  . Spinal steroid injections   . Chest tube insertion     for pneumothorax  . Inguinal hernia repair 2002, 2003    x2  . Cystectomy 2010, 2011  . Cholecystectomy 2005  . Lipoma excision 10/28/2011    Procedure: MINOR EXCISION LIPOMA;  Surgeon: Emelia Loron, MD;  Location: [facility];  Service: General;  Laterality: N/A;  2 back lipoma excison  1 x 2 cm subcutaneous, 3 x4 cm subcutaneous    Family History  Problem Relation Age of Onset  . Stroke Father   . Heart attack Mother   . Heart disease Mother     heart attack    Social History History  Substance Use Topics  . Smoking status: Never Smoker   . Smokeless tobacco: Never Used  . Alcohol Use: No    No Known Allergies  Current Outpatient Prescriptions  Medication Sig Dispense Refill  . OMEGA 3 1000 MG CAPS Take 1 capsule by mouth 2 (two) times daily.        . saw palmetto 500 MG capsule Take 500 mg by mouth daily.        . ciprofloxacin (CIPRO) 500 MG tablet       . gabapentin (NEURONTIN) 100 MG capsule Take 1-3 capsules (100-300 mg total) by mouth 2 (two) times daily.  30 capsule  3  . metroNIDAZOLE (FLAGYL) 500 MG tablet       . traMADol (ULTRAM) 50 MG tablet         Review of Systems Review of Systems  Blood pressure 126/70, pulse  68, temperature 98 F (36.7 C), temperature source Oral, resp. rate 18, height 6' (1.829 m), weight 209 lb 9.6 oz (95.074 kg).  Physical Exam Physical Exam  Vitals reviewed. Constitutional: He appears well-developed and well-nourished.  Musculoskeletal:       Legs:   Data Reviewed Prior notes  Assessment    Lipomatosis with symptomatic right posterior thigh lipoma    Plan    He would like this one excises is symptomatic. I think that that is reasonable. We discussed doing this in the office with the risk of bleeding and infection. I will send this to the pathologist.       [facility] 08/04/2012, 11:12 AM

## 2012-08-09 ENCOUNTER — Encounter: Payer: Self-pay | Admitting: Gastroenterology

## 2012-08-09 ENCOUNTER — Ambulatory Visit (INDEPENDENT_AMBULATORY_CARE_PROVIDER_SITE_OTHER): Payer: Medicare Other | Admitting: Gastroenterology

## 2012-08-09 VITALS — BP 100/70 | HR 68 | Ht 71.0 in | Wt 214.2 lb

## 2012-08-09 DIAGNOSIS — R109 Unspecified abdominal pain: Secondary | ICD-10-CM

## 2012-08-09 DIAGNOSIS — Z86010 Personal history of colonic polyps: Secondary | ICD-10-CM

## 2012-08-09 MED ORDER — GLYCOPYRROLATE 2 MG PO TABS: ORAL_TABLET | ORAL | Status: DC

## 2012-08-09 NOTE — Assessment & Plan Note (Signed)
Plan colonoscopy 2015 

## 2012-08-09 NOTE — Assessment & Plan Note (Addendum)
Patient has intermittent abdominal pain of uncertain etiology. It is unaccompanied by fever that might suggest diverticular disease. He denies change of bowel habits or history of bleeding.  Currently he is pain-free.  Recommendations #1 no GI workup at this time #2 hyomax as needed for pain

## 2012-08-09 NOTE — Patient Instructions (Addendum)
Follow up as needed We are sending in your prescription to your pharmacy

## 2012-08-09 NOTE — Progress Notes (Signed)
History of Present Illness: Pleasant 51 year old male referred at the request of Dr. Konrad Dolores for evaluation of abdominal pain. He underwent a partial colonic resection several years ago for what sounds like diverticulitis. Since that time he's had intermittent episodes of burning left periumbilical and left lower quadrant pain. Pain may last 5-10 days at a time and subside spontaneously. It is not accompanied by change in bowel habits, fever or bleeding. It's unaffected by eating. He has been treated in the past with antibiotics. He currently is pain-free. He probably underwent removal of adenomatous polyps in 2010. CT scan in May, 2013 demonstrated some small hepatic cysts.     Past Medical History  Diagnosis Date  . Right leg DVT august 2012  . Chronic back pain   . History of depression   . Diverticulosis 2006  . History of pneumothorax   . Hypertension   . Lipoma    Past Surgical History  Procedure Date  . Spinal steroid injections   . Chest tube insertion     for pneumothorax  . Inguinal hernia repair 2002, 2003    x2  . Cystectomy 2010, 2011  . Cholecystectomy 2005  . Lipoma excision 10/28/2011    Procedure: MINOR EXCISION LIPOMA;  Surgeon: Emelia Loron, MD;  Location: [facility];  Service: General;  Laterality: N/A;  2 back lipoma excison  1 x 2 cm subcutaneous, 3 x4 cm subcutaneous  . Small intestine surgery    family history includes Heart attack in his mother; Heart disease in his mother; and Stroke in his father. Current Outpatient Prescriptions  Medication Sig Dispense Refill  . OMEGA 3 1000 MG CAPS Take 1 capsule by mouth 2 (two) times daily.        . saw palmetto 500 MG capsule Take 500 mg by mouth daily.         Allergies as of 08/09/2012  . (No Known Allergies)    reports that he has never smoked. He has never used smokeless tobacco. He reports that he does not drink alcohol or use illicit drugs.     Review of Systems:  his chronic low back  pain Pertinent positive and negative review of systems were noted in the above HPI section. All other review of systems were otherwise negative.  Vital signs were reviewed in today's medical record Physical Exam: General: Well developed , well nourished, no acute distress Head: Normocephalic and atraumatic Eyes:  sclerae anicteric, EOMI Ears: Normal auditory acuity Mouth: No deformity or lesions Neck: Supple, no masses or thyromegaly Lungs: Clear throughout to auscultation Heart: Regular rate and rhythm; no murmurs, rubs or bruits Abdomen: Soft, non tender and non distended. No masses, hepatosplenomegaly or hernias noted. Normal Bowel sounds Rectal:deferred Musculoskeletal: Symmetrical with no gross deformities  Skin: No lesions on visible extremities Pulses:  Normal pulses noted Extremities: No clubbing, cyanosis, edema or deformities noted Neurological: Alert oriented x 4, grossly nonfocal Cervical Nodes:  No significant cervical adenopathy Inguinal Nodes: No significant inguinal adenopathy Psychological:  Alert and cooperative. Normal mood and affect

## 2012-08-15 ENCOUNTER — Encounter (INDEPENDENT_AMBULATORY_CARE_PROVIDER_SITE_OTHER): Payer: Self-pay | Admitting: General Surgery

## 2012-08-15 ENCOUNTER — Ambulatory Visit (INDEPENDENT_AMBULATORY_CARE_PROVIDER_SITE_OTHER): Payer: Medicare Other | Admitting: General Surgery

## 2012-08-15 DIAGNOSIS — D179 Benign lipomatous neoplasm, unspecified: Secondary | ICD-10-CM

## 2012-08-15 NOTE — Addendum Note (Signed)
Addended byLittie Deeds on: 08/15/2012 11:04 AM   Modules accepted: Orders

## 2012-08-15 NOTE — Progress Notes (Signed)
Subjective:     Patient ID: Zachary [name], male   DOB: 1960-12-30, 51 y.o.   MRN: [id number]  HPI 84 yom with multiple lipomas that I have taken off in past. Has a symptomatic one on posterior right thigh that he presents today for excision.  No changes since last visit  Review of Systems     Objective:   Physical Exam Right posterior thigh with 2x2 cm subq mass c/w lipoma    Assessment:     Right thigh lipoma    Plan:     We discussed procedure.  I cleansed the area with betadine and anesthetized with lidocaine.  I then made incision and removed a 2 cm subq lipoma.  Hemostasis was observed.  i closed with 3-0 vicryl, 4-0 monocryl, steristrips and a dressing.  He did well and I will see in 2 weeks.  Will remove dressing in 48 hours then shower

## 2012-08-16 ENCOUNTER — Ambulatory Visit (INDEPENDENT_AMBULATORY_CARE_PROVIDER_SITE_OTHER): Payer: Medicare Other | Admitting: Family Medicine

## 2012-08-16 ENCOUNTER — Encounter: Payer: Self-pay | Admitting: Family Medicine

## 2012-08-16 VITALS — BP 115/73 | HR 72 | Temp 97.8°F | Ht 71.0 in | Wt 213.3 lb

## 2012-08-16 DIAGNOSIS — M545 Low back pain, unspecified: Secondary | ICD-10-CM

## 2012-08-16 DIAGNOSIS — R52 Pain, unspecified: Secondary | ICD-10-CM

## 2012-08-16 DIAGNOSIS — M549 Dorsalgia, unspecified: Secondary | ICD-10-CM

## 2012-08-16 DIAGNOSIS — G8929 Other chronic pain: Secondary | ICD-10-CM

## 2012-08-16 MED ORDER — HYDROCODONE-ACETAMINOPHEN 7.5-500 MG PO TABS: 1.0000 | ORAL_TABLET | Freq: Four times a day (QID) | ORAL | Status: DC | PRN

## 2012-08-16 MED ORDER — METHOCARBAMOL 750 MG PO TABS: 750.0000 mg | ORAL_TABLET | Freq: Four times a day (QID) | ORAL | Status: DC

## 2012-08-16 MED ORDER — NAPROXEN 500 MG PO TABS: 500.0000 mg | ORAL_TABLET | Freq: Two times a day (BID) | ORAL | Status: DC

## 2012-08-16 NOTE — Patient Instructions (Addendum)
Do not drive while taking this medication.  Lumbosacral Strain Lumbosacral strain is one of the most common causes of back pain. There are many causes of back pain. Most are not serious conditions. CAUSES  Your backbone (spinal column) is made up of 24 main vertebral bodies, the sacrum, and the coccyx. These are held together by muscles and tough, fibrous tissue (ligaments). Nerve roots pass through the openings between the vertebrae. A sudden move or injury to the back may cause injury to, or pressure on, these nerves. This may result in localized back pain or pain movement (radiation) into the buttocks, down the leg, and into the foot. Sharp, shooting pain from the buttock down the back of the leg (sciatica) is frequently associated with a ruptured (herniated) disk. Pain may be caused by muscle spasm alone. Your caregiver can often find the cause of your pain by the details of your symptoms and an exam. In some cases, you may need tests (such as X-rays). Your caregiver will work with you to decide if any tests are needed based on your specific exam. HOME CARE INSTRUCTIONS   Avoid an underactive lifestyle. Active exercise, as directed by your caregiver, is your greatest weapon against back pain.  Avoid hard physical activities (tennis, racquetball, waterskiing) if you are not in proper physical condition for it. This may aggravate or create problems.  If you have a back problem, avoid sports requiring sudden body movements. Swimming and walking are generally safer activities.  Maintain good posture.  Avoid becoming overweight (obese).  Use bed rest for only the most extreme, sudden (acute) episode. Your caregiver will help you determine how much bed rest is necessary.  For acute conditions, you may put ice on the injured area.  Put ice in a plastic bag.  Place a towel between your skin and the bag.  Leave the ice on for 15 to 20 minutes at a time, every 2 hours, or as needed.  After you  are improved and more active, it may help to apply heat for 30 minutes before activities. See your caregiver if you are having pain that lasts longer than expected. Your caregiver can advise appropriate exercises or therapy if needed. With conditioning, most back problems can be avoided. SEEK IMMEDIATE MEDICAL CARE IF:   You have numbness, tingling, weakness, or problems with the use of your arms or legs.  You experience severe back pain not relieved with medicines.  There is a change in bowel or bladder control.  You have increasing pain in any area of the body, including your belly (abdomen).  You notice shortness of breath, dizziness, or feel faint.  You feel sick to your stomach (nauseous), are throwing up (vomiting), or become sweaty.  You notice discoloration of your toes or legs, or your feet get very cold.  Your back pain is getting worse.  You have a fever. MAKE SURE YOU:   Understand these instructions.  Will watch your condition.  Will get help right away if you are not doing well or get worse. Document Released: 07/21/2005 Document Revised: 01/03/2012 Document Reviewed: 01/10/2009 [facility] Patient Information 2013 [place], [place].  Back Pain, Adult Low back pain is very common. About 1 in 5 people have back pain.The cause of low back pain is rarely dangerous. The pain often gets better over time.About half of people with a sudden onset of back pain feel better in just 2 weeks. About 8 in 10 people feel better by 6 weeks.  CAUSES Some  common causes of back pain include:  Strain of the muscles or ligaments supporting the spine.  Wear and tear (degeneration) of the spinal discs.  Arthritis.  Direct injury to the back. DIAGNOSIS Most of the time, the direct cause of low back pain is not known.However, back pain can be treated effectively even when the exact cause of the pain is unknown.Answering your caregiver's questions about your overall health and symptoms  is one of the most accurate ways to make sure the cause of your pain is not dangerous. If your caregiver needs more information, he or she may order lab work or imaging tests (X-rays or MRIs).However, even if imaging tests show changes in your back, this usually does not require surgery. HOME CARE INSTRUCTIONS For many people, back pain returns.Since low back pain is rarely dangerous, it is often a condition that people can learn to [facility] their own.   Remain active. It is stressful on the back to sit or stand in one place. Do not sit, drive, or stand in one place for more than 30 minutes at a time. Take short walks on level surfaces as soon as pain allows.Try to increase the length of time you walk each day.  Do not stay in bed.Resting more than 1 or 2 days can delay your recovery.  Do not avoid exercise or work.Your body is made to move.It is not dangerous to be active, even though your back may hurt.Your back will likely heal faster if you return to being active before your pain is gone.  Pay attention to your body when you bend and lift. Many people have less discomfortwhen lifting if they bend their knees, keep the load close to their bodies,and avoid twisting. Often, the most comfortable positions are those that put less stress on your recovering back.  Find a comfortable position to sleep. Use a firm mattress and lie on your side with your knees slightly bent. If you lie on your back, put a pillow under your knees.  Only take over-the-counter or prescription medicines as directed by your caregiver. Over-the-counter medicines to reduce pain and inflammation are often the most helpful.Your caregiver may prescribe muscle relaxant drugs.These medicines help dull your pain so you can more quickly return to your normal activities and healthy exercise.  Put ice on the injured area.  Put ice in a plastic bag.  Place a towel between your skin and the bag.  Leave the ice on for 15  to 20 minutes, 3 to 4 times a day for the first 2 to 3 days. After that, ice and heat may be alternated to reduce pain and spasms.  Ask your caregiver about trying back exercises and gentle massage. This may be of some benefit.  Avoid feeling anxious or stressed.Stress increases muscle tension and can worsen back pain.It is important to recognize when you are anxious or stressed and learn ways to manage it.Exercise is a great option. SEEK MEDICAL CARE IF:  You have pain that is not relieved with rest or medicine.  You have pain that does not improve in 1 week.  You have new symptoms.  You are generally not feeling well. SEEK IMMEDIATE MEDICAL CARE IF:   You have pain that radiates from your back into your legs.  You develop new bowel or bladder control problems.  You have unusual weakness or numbness in your arms or legs.  You develop nausea or vomiting.  You develop abdominal pain.  You feel faint. Document Released: 10/11/2005  Document Revised: 04/11/2012 Document Reviewed: 03/01/2011 [facility] Patient Information 2013 [place], [place].

## 2012-08-16 NOTE — Progress Notes (Signed)
  Subjective:    Patient ID: Zachary [name], male    DOB: 26-May-1961, 51 y.o.   MRN: [id number]  HPI 51 y.o. male with hx chronic back pain, disc disease at L4. Has acute back pain since Sunday (3 days). Was lifting boxes. Felt a lot of pressure in back at time. Now pain/pressure is constant, especially when sitting, standing, moving. Has seen neurosurgeon in [place] and been in PT for herniated disc. No prior back surgery. Has been getting epidural steroid injections but not helping much any more. Has appt tomorrow but pain was too intense to wait for appointment.   Pain is lumbar area, midline, sharp, no radiation. When sitting/standing, feels pressure/pain down back. No weakness, numbness. Does have some tingling in ankles bilaterally but this appears to be chronic. No bowel/bladder changes.  Review of Systems  Constitutional: Negative for fever and chills.  HENT: Negative for neck pain and neck stiffness.   Eyes: Negative for visual disturbance.  All other systems reviewed and are negative.       Objective:   Physical Exam  Constitutional: He is oriented to person, place, and time. He appears well-developed and well-nourished. No distress.  HENT:  Head: Normocephalic and atraumatic.  Cardiovascular: Normal rate, regular rhythm and normal heart sounds.   Pulmonary/Chest: Effort normal and breath sounds normal. No respiratory distress.  Musculoskeletal:       Tender over lumbar spine and paraspinous muscles, mild. Pain with raising legs off chair. Stooped when walking.   Neurological: He is alert and oriented to person, place, and time.       Strength 5/5 upper and lower extremities and equal bilaterally. Sensation grossly intact and equal bilaterally. DTRs difficult to elicit bilaterally.   Filed Vitals:   08/16/12 1348  BP: 115/73  Pulse: 72  Temp: 97.8 F (36.6 C)       Assessment & Plan:  51 y.o. male with acute on chronic back pain.  - likely herniated disc.  -  Naproxen, methocarbamol, vicodin - Follow up with neurosurgeon tomorrow as scheduled. - Heat/ice  Napoleon Form, MD

## 2012-08-17 ENCOUNTER — Telehealth (INDEPENDENT_AMBULATORY_CARE_PROVIDER_SITE_OTHER): Payer: Self-pay | Admitting: General Surgery

## 2012-08-17 NOTE — Telephone Encounter (Signed)
Message copied by Littie Deeds on Thu Aug 17, 2012  8:52 AM ------      Message from: [facility], MATTHEW      Created: Wed Aug 16, 2012  6:53 PM       Would you call and tell him this is benign lipoma      ----- Message -----         From: [facility]         Sent: 08/16/2012   6:47 PM           To: Emelia Loron, MD

## 2012-08-17 NOTE — Telephone Encounter (Signed)
Spoke with patient and informed him that his pathology came back as a benign lipoma.  He was pleased with this.

## 2012-08-28 ENCOUNTER — Encounter (INDEPENDENT_AMBULATORY_CARE_PROVIDER_SITE_OTHER): Payer: Self-pay | Admitting: General Surgery

## 2012-08-28 ENCOUNTER — Ambulatory Visit (INDEPENDENT_AMBULATORY_CARE_PROVIDER_SITE_OTHER): Payer: Medicare Other | Admitting: General Surgery

## 2012-08-28 VITALS — BP 124/62 | HR 76 | Temp 97.8°F | Resp 20 | Ht 73.0 in | Wt 210.4 lb

## 2012-08-28 DIAGNOSIS — Z09 Encounter for follow-up examination after completed treatment for conditions other than malignant neoplasm: Secondary | ICD-10-CM

## 2012-08-28 NOTE — Progress Notes (Signed)
Subjective:     Patient ID: Zachary [name], male   DOB: Jul 29, 1961, 52 y.o.   MRN: [id number]  HPI This is a 51 year old male with multiple lipomas. I recently removed a right posterior thigh lipoma in the office. He is doing well without complaints. He did injure his back separate from this which is bothered him right now. His pathology returned as angiolipoma.  Review of Systems     Objective:   Physical Exam Posterior right thigh incision healing well without infection    Assessment:     Status post lipoma excision    Plan:     He is doing well I released to full activity. He has one additional one on his left chest wall that he would like removed and we will schedule him for that now also.

## 2012-09-15 ENCOUNTER — Ambulatory Visit (INDEPENDENT_AMBULATORY_CARE_PROVIDER_SITE_OTHER): Payer: Medicare Other | Admitting: Family Medicine

## 2012-09-15 ENCOUNTER — Ambulatory Visit
Admission: RE | Admit: 2012-09-15 | Discharge: 2012-09-15 | Disposition: A | Discharge status: Home / Self Care | Payer: Medicare Other | Source: Ambulatory Visit | Attending: Family Medicine | Admitting: Family Medicine

## 2012-09-15 ENCOUNTER — Encounter: Payer: Self-pay | Admitting: Family Medicine

## 2012-09-15 VITALS — BP 118/70 | Temp 98.3°F | Ht 73.0 in | Wt 211.9 lb

## 2012-09-15 DIAGNOSIS — M549 Dorsalgia, unspecified: Secondary | ICD-10-CM

## 2012-09-15 DIAGNOSIS — R350 Frequency of micturition: Secondary | ICD-10-CM

## 2012-09-15 DIAGNOSIS — R319 Hematuria, unspecified: Secondary | ICD-10-CM

## 2012-09-15 LAB — POCT URINALYSIS DIPSTICK
Bilirubin, UA: NEGATIVE
Ketones, UA: NEGATIVE
Leukocytes, UA: NEGATIVE
Protein, UA: NEGATIVE
Spec Grav, UA: 1.02

## 2012-09-15 LAB — CBC
Hemoglobin: 15.7 g/dL (ref 13.0–17.0)
MCHC: 34.7 g/dL (ref 30.0–36.0)
RDW: 14.2 % (ref 11.5–15.5)

## 2012-09-15 LAB — BASIC METABOLIC PANEL
Glucose, Bld: 120 mg/dL — ABNORMAL HIGH (ref 70–99)
Potassium: 4 mEq/L (ref 3.5–5.3)
Sodium: 138 mEq/L (ref 135–145)

## 2012-09-15 LAB — POCT UA - MICROSCOPIC ONLY

## 2012-09-15 LAB — GLUCOSE, CAPILLARY: Glucose-Capillary: 106 mg/dL — ABNORMAL HIGH (ref 70–99)

## 2012-09-15 MED ORDER — IBUPROFEN 800 MG PO TABS: 800.0000 mg | ORAL_TABLET | Freq: Three times a day (TID) | ORAL | Status: DC | PRN

## 2012-09-15 MED ORDER — HYDROCODONE-ACETAMINOPHEN 5-325 MG PO TABS: 1.0000 | ORAL_TABLET | Freq: Four times a day (QID) | ORAL | Status: DC | PRN

## 2012-09-15 NOTE — Assessment & Plan Note (Addendum)
Initially concerned for new onset diabetes in patient with polyuria/polydipsia after recent prednisone usage. CBG today was 106, about 2 hours after eating full breakfast.  No glucose on UA.  UA did show microscopic hematuria. Likely nephrolithiasis in this male with flank pain and hematuria. Provided anaglesia (NSAID plus opiate if needed) and strainer.   Non contrast helical CT to check for nephrolithiasis Will call patient with results. Gave red flags to return to clinic/go to ED  Otherwise FU with him early next week.

## 2012-09-15 NOTE — Progress Notes (Signed)
  Subjective:    Patient ID: Zachary [name], male    DOB: [birth year], 51 y.o.   MRN: [id number]  HPI  1.  Back pain and increased urination:  51 yo M with Left flank pain for past 2 weeks.  Has had increasing urinary frequency as well for that time period.  He has history of lower back pain, and recently saw an Orthopedic physician who put him on Prednisone.  After he finished taper, began having increased frequency.   Denies any hesitancy, dysuria.  Does endorse polydipsia.  No weight changes.  No fevers or chills.  No penile discharge.  No abdominal pain.  Vague nausea at times, no vomiting.    Review of Systems See HPI above for review of systems.       Objective:   Physical Exam Gen:  Alert, cooperative patient who appears stated age in no acute distress.  Vital signs reviewed. Cardiac:  Regular rate and rhythm without murmur auscultated.  Good S1/S2. Pulm:  Clear to auscultation bilaterally with good air movement.  No wheezes or rales noted.   Abdomen:  Nontender, no distension.  Good bowel sounds Back:  No CVA tenderness.  Some tenderness to palpation over Left flank.         Assessment & Plan:

## 2012-09-15 NOTE — Patient Instructions (Addendum)
I'm concerned that you might have a kidney stone.    I am going to get you set up for a CT of your abdomen.    This will let us know what's going on.    Take the Ibuprofen for pain relief every 8 hours if you need it.  If this is not strong enough, you can take the Norco.    It was good to meet you today.

## 2012-09-17 LAB — URINE CULTURE: Organism ID, Bacteria: NO GROWTH

## 2012-09-18 ENCOUNTER — Telehealth: Payer: Self-pay | Admitting: Family Medicine

## 2012-09-18 MED ORDER — CEPHALEXIN 500 MG PO CAPS: 500.0000 mg | ORAL_CAPSULE | Freq: Three times a day (TID) | ORAL | Status: DC

## 2012-09-18 NOTE — Telephone Encounter (Signed)
Called and discussed negative CT scan abdomen.  Patient still with polyuria.  Had normal CBG last visit, no glucose in urine.  Now complaining of some dysuria as well. No penile discharge, no abdominal pain or scrotal pain.  Able to have full void every time he goes to restroom, no hesitancy.  Drinking cranberry juice with some relief.  No visible blood.   Had negative urine culture with microscopic hematuria.  Will treat with presumptive antibiotics as he has LUTS symptoms, although this likely low yield. He is to return in 2 weeks for recheck and should likely have urology referral at that time in 51 yo M with microscopic hematuria and negative urine culture.

## 2012-09-25 ENCOUNTER — Ambulatory Visit (INDEPENDENT_AMBULATORY_CARE_PROVIDER_SITE_OTHER): Payer: Medicare Other | Admitting: General Surgery

## 2012-09-25 ENCOUNTER — Encounter (INDEPENDENT_AMBULATORY_CARE_PROVIDER_SITE_OTHER): Payer: Self-pay | Admitting: General Surgery

## 2012-09-25 DIAGNOSIS — D1739 Benign lipomatous neoplasm of skin and subcutaneous tissue of other sites: Secondary | ICD-10-CM

## 2012-09-25 DIAGNOSIS — D171 Benign lipomatous neoplasm of skin and subcutaneous tissue of trunk: Secondary | ICD-10-CM

## 2012-09-25 NOTE — Progress Notes (Signed)
Subjective:     Patient ID: Zachary [name], male   DOB: 1961-09-10, 51 y.o.   MRN: [id number]  HPI This is a 51 year old male that I know well from multiple lipoma excisions. He presents today to the clinic for removal of a left abdominal wall lipoma. He reports no interval change in his health history. This area is still bothering him and he would like it excised. We discussed an excision with the risk of infection associated with that.  Review of Systems     Objective:   Physical Exam Left abdominal wall lipoma measuring 2 x 2 centimeters, subcutaneous    Assessment:     Left abdominal wall lipoma    Plan:     I closed this area with Betadine. I then anesthetized with lidocaine. I made an incision and excised the lipoma in its entirety. I then closes with 3-0 Vicryl, 4-0 Monocryl, and Dermabond. I will plan on seeing him back in 3 weeks. I sent the specimen for pathology.

## 2012-09-27 ENCOUNTER — Telehealth (INDEPENDENT_AMBULATORY_CARE_PROVIDER_SITE_OTHER): Payer: Self-pay | Admitting: General Surgery

## 2012-09-27 NOTE — Telephone Encounter (Signed)
Spoke with pt and informed him that his surgical pathology came back and it was only a lipoma.

## 2012-09-27 NOTE — Telephone Encounter (Signed)
Message copied by Littie Deeds on Wed Sep 27, 2012  9:34 AM ------      Message from: Dwain Sarna, MATTHEW      Created: Wed Sep 27, 2012  7:34 AM       Would you call him and tell him this is another lipoma please      ----- Message -----         From: [facility]         Sent: 09/26/2012  11:51 PM           To: Emelia Loron, MD

## 2012-10-16 ENCOUNTER — Encounter (INDEPENDENT_AMBULATORY_CARE_PROVIDER_SITE_OTHER): Payer: Medicare Other | Admitting: General Surgery

## 2012-10-19 ENCOUNTER — Ambulatory Visit (INDEPENDENT_AMBULATORY_CARE_PROVIDER_SITE_OTHER): Payer: Medicare Other | Admitting: Family Medicine

## 2012-10-19 ENCOUNTER — Encounter: Payer: Self-pay | Admitting: Family Medicine

## 2012-10-19 VITALS — BP 121/83 | HR 70 | Temp 97.9°F | Ht 73.0 in | Wt 214.0 lb

## 2012-10-19 DIAGNOSIS — R300 Dysuria: Secondary | ICD-10-CM | POA: Insufficient documentation

## 2012-10-19 DIAGNOSIS — R3911 Hesitancy of micturition: Secondary | ICD-10-CM | POA: Insufficient documentation

## 2012-10-19 LAB — POCT URINALYSIS DIPSTICK
Bilirubin, UA: NEGATIVE
Glucose, UA: NEGATIVE
Ketones, UA: NEGATIVE
Leukocytes, UA: NEGATIVE
Spec Grav, UA: 1.015

## 2012-10-19 LAB — PSA: PSA: 0.65 ng/mL (ref <=4.00)

## 2012-10-19 MED ORDER — PHENAZOPYRIDINE HCL 100 MG PO TABS: 100.0000 mg | ORAL_TABLET | Freq: Three times a day (TID) | ORAL | Status: DC | PRN

## 2012-10-19 NOTE — Patient Instructions (Addendum)
Stop all the tea and sal palmetto  After 3 days if you are still having symptoms then try pyridium one tablet three times daily for 3 days (it will make your urine orange).  If you are not feeling better in 2 weeks then come back to see your regular doctor

## 2012-10-19 NOTE — Assessment & Plan Note (Signed)
New.  Patient with small hematuria that has now resolved with recent normal bmet and CT.  Persistent hard to describe urinary symptoms.  Differential includes includes BPH but he is relatively young, infection but unlikely with normal urine, interstitial cystitis, irritation from teas etc. Asked him to stop all herbal medications and if that does not help then a trial of pyridium.  If symptoms persist then may want to try flomax.  Check psa today

## 2012-10-19 NOTE — Progress Notes (Signed)
  Subjective:    Patient ID: Zachary [name], male    DOB: November 03, 1960, 51 y.o.   MRN: [id number]  HPI  Urinary Frequency/Hesitancy Since last seen in November for work up of possible renal stone.  Has to get up several times a night and urinate small amounts.  Feels his force of stream is normal but that he has a pressure sensation like he has to push.  No pain in penis or change in urine color or back pain or fever. Drinking large amounts of herbal teas (for urine) and taking saw palmetto regularly   No prescription medication  PMH Recent BMET, CT were normal.  Prior UA showed small amount of blood  FH - father did not have prostate problems  SH - on chronic disability   Review of Systems     Objective:   Physical Exam Alert no acute distress Abdomen: soft and non-tender without masses, organomegaly or hernias noted.  No guarding or rebound Rectal: normal size firm prostate without masses or irregularities.  No pain  Penis - circumcised non irritation Testicles - normal size nontender        Assessment & Plan:

## 2012-10-20 ENCOUNTER — Encounter (INDEPENDENT_AMBULATORY_CARE_PROVIDER_SITE_OTHER): Payer: Medicare Other | Admitting: General Surgery

## 2012-10-23 ENCOUNTER — Ambulatory Visit (INDEPENDENT_AMBULATORY_CARE_PROVIDER_SITE_OTHER): Payer: Medicare Other | Admitting: Family Medicine

## 2012-10-23 VITALS — BP 121/82 | HR 77 | Temp 97.8°F | Ht 73.0 in | Wt 207.0 lb

## 2012-10-23 DIAGNOSIS — R3911 Hesitancy of micturition: Secondary | ICD-10-CM

## 2012-10-23 DIAGNOSIS — R300 Dysuria: Secondary | ICD-10-CM

## 2012-10-23 LAB — POCT UA - MICROSCOPIC ONLY

## 2012-10-23 LAB — POCT URINALYSIS DIPSTICK
Bilirubin, UA: NEGATIVE
Blood, UA: NEGATIVE
Glucose, UA: NEGATIVE
Leukocytes, UA: NEGATIVE
Nitrite, UA: POSITIVE
Urobilinogen, UA: 0.2

## 2012-10-23 MED ORDER — TAMSULOSIN HCL 0.4 MG PO CAPS: 0.4000 mg | ORAL_CAPSULE | Freq: Every day | ORAL | Status: DC

## 2012-10-23 MED ORDER — DOXYCYCLINE HYCLATE 100 MG PO TABS: 100.0000 mg | ORAL_TABLET | Freq: Two times a day (BID) | ORAL | Status: DC

## 2012-10-23 NOTE — Patient Instructions (Signed)
The urine test looked good.    Take the Doxycycline for 7 days, twice a day.  This is for infection.    Take the Flomax 1 pill a day.  Try this for the next several weeks.  Call or come back in about 3-4 weeks so we can see how you're doing at that point.    It was good to see you, have a Happy New Year

## 2012-10-23 NOTE — Addendum Note (Signed)
Addended by: [place], Kariana Wiles on: 10/23/2012 05:30 PM   Modules accepted: Orders

## 2012-10-23 NOTE — Progress Notes (Signed)
  Subjective:    Patient ID: Zachary [name], male    DOB: 09-07-1961, 51 y.o.   MRN: [id number]  HPI  1.  Urinary hesitancy:  Persists.  Patient seen here for trouble with urinary pressure and what he describes as "overactive bladder" for past several weeks.  Initially resolved with hydrocodone but has returned.  No relief with Pyridium from last appointment.  Occasional nocturia.  Also describes urinary frequency.    No fevers or chills.  No abdominal pain.  No back pain.    Review of Systems See HPI above for review of systems.       Objective:   Physical Exam Gen:  Alert, cooperative patient who appears stated age in no acute distress.  Vital signs reviewed. Abd:  Soft/nondistended/nontender.  Good bowel sounds throughout all four quadrants.  No masses noted.  Rectal:  deferred       Assessment & Plan:

## 2012-10-23 NOTE — Assessment & Plan Note (Signed)
Will attempt Doxycycline to treat any nongonococcal urethritis that he might be having.   I believe it is more likely these are LUTS that may be relieved by Flomax.  Will attempt with FU later this month to assess for improvement.   No red flags today on exam.

## 2013-02-26 ENCOUNTER — Encounter: Payer: Self-pay | Admitting: Family Medicine

## 2013-02-26 ENCOUNTER — Ambulatory Visit (INDEPENDENT_AMBULATORY_CARE_PROVIDER_SITE_OTHER): Payer: Medicare Other | Admitting: Family Medicine

## 2013-02-26 VITALS — BP 112/78 | HR 63 | Temp 97.7°F | Ht 72.0 in | Wt 209.4 lb

## 2013-02-26 DIAGNOSIS — R3911 Hesitancy of micturition: Secondary | ICD-10-CM

## 2013-02-26 DIAGNOSIS — G8929 Other chronic pain: Secondary | ICD-10-CM

## 2013-02-26 DIAGNOSIS — M549 Dorsalgia, unspecified: Secondary | ICD-10-CM

## 2013-02-26 DIAGNOSIS — E785 Hyperlipidemia, unspecified: Secondary | ICD-10-CM

## 2013-02-26 DIAGNOSIS — N390 Urinary tract infection, site not specified: Secondary | ICD-10-CM

## 2013-02-26 LAB — POCT URINALYSIS DIPSTICK
Ketones, UA: NEGATIVE
Protein, UA: NEGATIVE
Spec Grav, UA: 1.025

## 2013-02-26 LAB — POCT UA - MICROSCOPIC ONLY

## 2013-02-26 MED ORDER — CYCLOBENZAPRINE HCL 5 MG PO TABS: 5.0000 mg | ORAL_TABLET | Freq: Three times a day (TID) | ORAL | Status: DC | PRN

## 2013-02-26 MED ORDER — PHENAZOPYRIDINE HCL 100 MG PO TABS: 100.0000 mg | ORAL_TABLET | Freq: Three times a day (TID) | ORAL | Status: DC | PRN

## 2013-02-26 MED ORDER — TAMSULOSIN HCL 0.4 MG PO CAPS: 0.4000 mg | ORAL_CAPSULE | Freq: Every day | ORAL | Status: DC

## 2013-02-26 NOTE — Assessment & Plan Note (Signed)
Flexeril today for muscle spasm Large lipoma on R painful to deep palpation likely from mass effect on nerve.  Pt will ultimately need surgery to correct.

## 2013-02-26 NOTE — Assessment & Plan Note (Signed)
UA nml today Etiology of LUTS unclear.  Refill Flomax and pyridium. As these provided relief in past. Pt to fill flomax first adn only pyridium if no benefit from flomax.

## 2013-02-26 NOTE — Assessment & Plan Note (Signed)
Elevated in the past.  Not trying anything at this time for reduction Will have pt back tomorrow for fasting panel.  Will calculate risk and make recommendations based on last AHA guidelines

## 2013-02-26 NOTE — Patient Instructions (Addendum)
THank you for coming in today You are doing well Your back pain is from muscle spasm and from a lipoma deep down in your back You need to start flomax again for your urinary complaints Please call in 2 weeks if you are not better Please come in for fasting blood work at [facility].

## 2013-02-26 NOTE — Progress Notes (Signed)
Zachary [name] is a 52 y.o. male who presents to [facility] today for urinary pain  Urinary pain: initially used flomax w/ benefit but ran out and now returning. Feels pressure when urinating. Good stream w/ urination. No hematuria. Never a smoker. Denies dysuria, fever, lower abdominal pain.   Back pain: onset 2 wks ago. Located in R lower back. Multiple lipomas in area. Constant. Worse w/ certain movement and sitting.   HLD: denies CP, SOB.   The following portions of the patient's history were reviewed and updated as appropriate: allergies, current medications, past medical history, family and social history, and problem list.  Patient is a nonsmoker.   Past Medical History  Diagnosis Date  . Right leg DVT august 2012  . Chronic back pain   . History of depression   . Diverticulosis 2006  . History of pneumothorax   . Hypertension   . Lipoma     ROS as above otherwise neg.    Medications reviewed. Current Outpatient Prescriptions  Medication Sig Dispense Refill  . cyclobenzaprine (FLEXERIL) 5 MG tablet Take 1 tablet (5 mg total) by mouth 3 (three) times daily as needed for muscle spasms.  30 tablet  0  . phenazopyridine (PYRIDIUM) 100 MG tablet Take 1 tablet (100 mg total) by mouth 3 (three) times daily as needed for pain.  30 tablet  0  . tamsulosin (FLOMAX) 0.4 MG CAPS Take 1 capsule (0.4 mg total) by mouth daily after supper.  90 capsule  3   No current facility-administered medications for this visit.    Exam: BP 112/78  Pulse 63  Temp(Src) 97.7 F (36.5 C) (Oral)  Ht 6' (1.829 m)  Wt 209 lb 6 oz (94.972 kg)  BMI 28.39 kg/m2 Gen: Well NAD HEENT: EOMI,  MMM MSK: perispinal lumbar muscle tightness and tenderness on palpation on R side.   Results for orders placed in visit on 02/26/13 (from the past 72 hour(s))  POCT URINALYSIS DIPSTICK     Status: Abnormal   Collection Time    02/26/13 10:10 AM      Result Value Range   Color, UA YELLOW     Clarity, UA CLEAR     Glucose, UA NEG     Bilirubin, UA NEG     Ketones, UA NEG     Spec Grav, UA 1.025     Blood, UA TRACE-LYSED     pH, UA 5.5     Protein, UA NEG     Urobilinogen, UA 0.2     Nitrite, UA NEG     Leukocytes, UA Negative    POCT UA - MICROSCOPIC ONLY     Status: None   Collection Time    02/26/13 10:10 AM      Result Value Range   RBC, urine, microscopic 0-3     Bacteria, U Microscopic TRACE

## 2013-02-28 ENCOUNTER — Other Ambulatory Visit: Payer: Medicare Other

## 2013-02-28 DIAGNOSIS — E785 Hyperlipidemia, unspecified: Secondary | ICD-10-CM

## 2013-02-28 LAB — LIPID PANEL
Cholesterol: 225 mg/dL — ABNORMAL HIGH (ref 0–200)
Total CHOL/HDL Ratio: 6.3 Ratio

## 2013-02-28 NOTE — Progress Notes (Signed)
FLP DONE TODAY Zachary [name] 

## 2013-03-01 NOTE — Assessment & Plan Note (Signed)
Hypertriglyceridemia noted on labs. Calculated LDL of 86 Calculated 10 year cardiovascular/cerebral risk of 5.2% No CAD or seignificant risk factors so no indication for therapy at this time.

## 2013-03-01 NOTE — Progress Notes (Signed)
Called to inform pt of cholesterol studies. No need for therapy at this time.   Shelly Flatten, MD Family Medicine PGY-2 03/01/2013, 12:12 PM

## 2013-06-27 ENCOUNTER — Ambulatory Visit (INDEPENDENT_AMBULATORY_CARE_PROVIDER_SITE_OTHER): Payer: Medicare Other | Admitting: Family Medicine

## 2013-06-27 ENCOUNTER — Ambulatory Visit (HOSPITAL_COMMUNITY)
Admission: RE | Admit: 2013-06-27 | Discharge: 2013-06-27 | Disposition: A | Discharge status: Home / Self Care | Payer: Medicare Other | Source: Ambulatory Visit | Attending: Family Medicine | Admitting: Family Medicine

## 2013-06-27 ENCOUNTER — Encounter: Payer: Self-pay | Admitting: Family Medicine

## 2013-06-27 VITALS — BP 111/77 | HR 75 | Temp 98.0°F | Ht 73.0 in | Wt 213.0 lb

## 2013-06-27 DIAGNOSIS — M79609 Pain in unspecified limb: Secondary | ICD-10-CM | POA: Insufficient documentation

## 2013-06-27 DIAGNOSIS — M79662 Pain in left lower leg: Secondary | ICD-10-CM | POA: Insufficient documentation

## 2013-06-27 DIAGNOSIS — Z86718 Personal history of other venous thrombosis and embolism: Secondary | ICD-10-CM | POA: Insufficient documentation

## 2013-06-27 NOTE — Progress Notes (Signed)
Patient ID: Bryceton Hantz, male   DOB: [birth year], 52 y.o.   MRN: [id number]  [facility] Encarnacion Bole M. Kemani Demarais, MD Phone: [phone]   Subjective: HPI: Patient is a 52 y.o. male presenting to clinic today for same day appointment for leg pain.  Patient presents for left leg pain. He was diagnosed with DVT in right leg x2 in 2012, on Coumadin at that time but no longer. He had calf pain in 2013, but did NOT have evidence of DVT then. He states he had similar pain in 2005 and was diagnosed with a cyst.  He states he was walking around a flea market last Thursday and had sharp pain in left calf. Pain improved but did not go away completely. On Sunday, he was walking and the pain returned and has been persistently worse. He noticed it more with going down steps. He reports swelling of his calf. No redness, no shortness of breath, no chest pain. No injury of the leg.   History Reviewed: Non smoker.  ROS: Please see HPI above.  Objective: Office vital signs reviewed. BP 111/77  Pulse 75  Temp(Src) 98 F (36.7 C) (Oral)  Ht 6\' 1"  (1.854 m)  Wt 213 lb (96.616 kg)  BMI 28.11 kg/m2  Physical Examination:  General: Awake, alert. NAD Pulm: CTAB, no wheezes. No pain with deep inspiration Cardio: RRR, no murmurs appreciated Extremities: Right lower extremity full ROM, no pain. Left calf with TTP of posterior and medial aspects. No redness, but does have +Homan's. No cord palpated. Neuro: Grossly intact  R leg 39cm L leg 40cm  Assessment: 52 y.o. male with left calf pain and swelling, h/o DVT  Plan: See Problem List and After Visit Summary

## 2013-06-27 NOTE — Progress Notes (Signed)
*  PRELIMINARY RESULTS* Vascular Ultrasound Left lower extremity venous duplex has been completed.  Preliminary findings: negative for DVT and baker's cyst.  Called report to Dr. Mikel Cella.   Farrel Demark, RDMS, RVT  06/27/2013, 11:11 AM

## 2013-06-27 NOTE — Patient Instructions (Addendum)
Please have your ultrasound done at 10:30 today. I will call you with the results. If it does show a DVT, you will be started on Xarelto for anticoagulation. I have given you some information about this medication below.  Please follow up with Dr. Konrad Dolores as needed.  Kari Montero M. Maddix Kliewer, M.D.  Rivaroxaban oral tablets What is this medicine? RIVAROXABAN (ri va ROX a ban) is an anticoagulant (blood thinner). It is used to treat blood clots in the lungs or in the veins. It is also used after knee or hip surgeries to prevent blood clots. It is also used to lower the chance of stroke in people with a medical condition called atrial fibrillation. This medicine may be used for other purposes; ask your health care provider or pharmacist if you have questions. What should I tell my health care provider before I take this medicine? They need to know if you have any of these conditions: -bleeding disorders -bleeding in the brain -blood in your stools (black or tarry stools) or if you have blood in your vomit -history of stomach bleeding -kidney disease -liver disease -low blood counts, like low white cell, platelet, or red cell counts -recent or planned spinal or epidural procedure -take medicines that treat or prevent blood clots -an unusual or allergic reaction to rivaroxaban, other medicines, foods, dyes, or preservatives -pregnant or trying to get pregnant -breast-feeding How should I use this medicine? Take this medicine by mouth with a glass of water. Follow the directions on the prescription label. Take your medicine at regular intervals. Do not take it more often than directed. Do not stop taking except on your doctor's advice. If you are taking this medicine after hip or knee replacement surgery, take it with or without food. If you are taking this medicine for atrial fibrillation, take it with your evening meal. If you are taking this medicine to treat blood clots, take it with food at the  same time each day. If you are unable to swallow your tablet, you may crush the tablet and mix it in applesauce. Then, immediately eat the applesauce. You should eat more food right after you eat the applesauce containing the crushed tablet. Talk to your pediatrician regarding the use of this medicine in children. Special care may be needed. Overdosage: If you think you've taken too much of this medicine contact a poison control center or emergency room at once. Overdosage: If you think you have taken too much of this medicine contact a poison control center or emergency room at once. NOTE: This medicine is only for you. Do not share this medicine with others. What if I miss a dose? If you take your medicine once a day and miss a dose, take the missed dose as soon as you remember. If you take your medicine twice a day and miss a dose, take the missed dose immediately. In this instance, 2 tablets may be taken at the same time. The next day you should take 1 tablet twice a day as directed. What may interact with this medicine? -aspirin and aspirin-like medicines -certain antibiotics like erythromycin, azithromycin, and clarithromycin -certain medicines for fungal infections like ketoconazole and itraconazole -certain medicines for irregular heart beat like amiodarone, quinidine, dronedarone -certain medicines for seizures like carbamazepine, phenytoin -certain medicines that treat or prevent blood clots like warfarin, enoxaparin, and dalteparin  -conivaptan -diltiazem -felodipine -indinavir -lopinavir; ritonavir -NSAIDS, medicines for pain and inflammation, like ibuprofen or naproxen -ranolazine -rifampin -ritonavir -St. John's wort -verapamil This  list may not describe all possible interactions. Give your health care provider a list of all the medicines, herbs, non-prescription drugs, or dietary supplements you use. Also tell them if you smoke, drink alcohol, or use illegal drugs. Some items  may interact with your medicine. What should I watch for while using this medicine? Do not stop taking this medicine without first talking to your doctor. Stopping this medicine may increase your risk of having a stroke. Be sure to refill your prescription before you run out of medicine. This medicine may increase your risk to bruise or bleed. Call your doctor or health care professional if you notice any unusual bleeding. Be careful brushing and flossing your teeth or using a toothpick because you may bleed more easily. If you have any dental work done, tell your dentist you are receiving this medicine. What side effects may I notice from receiving this medicine? Side effects that you should report to your doctor or health care professional as soon as possible: -allergic reactions like skin rash, itching or hives, swelling of the face, lips, or tongue -bloody or black, tarry stools -changes in vision -confusion, trouble speaking or understanding -red or dark-brown urine -redness, blistering, peeling or loosening of the skin, including inside the mouth -severe headaches -spitting up blood or brown material that looks like coffee grounds -sudden numbness or weakness of the face, arm or leg -trouble walking, dizziness, loss of balance or coordination -unusual bruising or bleeding from the eye, gums, or nose  Side effects that usually do not require medical attention (Report these to your doctor or health care professional if they continue or are bothersome.): -dizziness -muscle pain This list may not describe all possible side effects. Call your doctor for medical advice about side effects. You may report side effects to FDA at 1-800-FDA-1088. Where should I keep my medicine? Keep out of the reach of children. Store at room temperature between 15 and 30 degrees C (59 and 86 degrees F). Throw away any unused medicine after the expiration date. NOTE: This sheet is a summary. It may not cover all  possible information. If you have questions about this medicine, talk to your doctor, pharmacist, or health care provider.  2013, [employer]. (12/29/2011 8:55:13 AM)

## 2013-06-27 NOTE — Assessment & Plan Note (Signed)
Patient with history of DVT, presenting with similar pain. Will check venous duplex of LLE today at 10:30. If positive, patient will need to start Xarelto and I have discussed this with him. Since he has prior DVT, this will now be lifelong therapy. Pt to follow up with Dr. Konrad Dolores if pain persists.

## 2013-09-05 ENCOUNTER — Ambulatory Visit (INDEPENDENT_AMBULATORY_CARE_PROVIDER_SITE_OTHER): Payer: Medicare Other | Admitting: Family Medicine

## 2013-09-05 ENCOUNTER — Encounter: Payer: Self-pay | Admitting: Family Medicine

## 2013-09-05 VITALS — BP 121/70 | HR 66 | Temp 98.0°F | Ht 72.0 in | Wt 214.0 lb

## 2013-09-05 DIAGNOSIS — G8929 Other chronic pain: Secondary | ICD-10-CM

## 2013-09-05 DIAGNOSIS — R351 Nocturia: Secondary | ICD-10-CM

## 2013-09-05 DIAGNOSIS — R3911 Hesitancy of micturition: Secondary | ICD-10-CM

## 2013-09-05 DIAGNOSIS — M549 Dorsalgia, unspecified: Secondary | ICD-10-CM

## 2013-09-05 LAB — POCT URINALYSIS DIPSTICK
Glucose, UA: NEGATIVE
Ketones, UA: NEGATIVE
Protein, UA: NEGATIVE
Spec Grav, UA: >=1.03

## 2013-09-05 MED ORDER — TAMSULOSIN HCL 0.4 MG PO CAPS: 0.4000 mg | ORAL_CAPSULE | Freq: Every day | ORAL | Status: DC

## 2013-09-05 MED ORDER — CYCLOBENZAPRINE HCL 5 MG PO TABS: 5.0000 mg | ORAL_TABLET | Freq: Three times a day (TID) | ORAL | Status: DC | PRN

## 2013-09-05 NOTE — Progress Notes (Signed)
  Tana Conch, MD Phone: [phone]  Subjective:  Chief complaint-noted  # Left lower back pain Location-left lower back Quality-aching, pulling. Occasional sharp pain or pressure. Feels somewhat like pain he has had on right side in past.  Severity-7/10 at most severe Duration-10 days Timing-cannot remember straing or injury Context-history of low back pain typically more on right side Modifying factors-worse with movement Associated symptoms-nocturia up to 5-6 x a night since stopping flomax over a month ago, no recent change in urination though  ROS-Denies nausea/vomiting/fever/chills/fatigue/overall sick feelings. No weakness in legs or paresthesias, no fecal or urinary incontinence or saddle anesthesia. No dysuria.   Past Medical History-suspected BPH with persistent urinary hesitancy and nocturia, chronic back pain, history of lipomas, hyperlipidemia   Medications- reviewed and updated Current Outpatient Prescriptions on File Prior to Visit  Medication Sig Dispense Refill  . phenazopyridine (PYRIDIUM) 100 MG tablet Take 1 tablet (100 mg total) by mouth 3 (three) times daily as needed for pain.  30 tablet  0   No current facility-administered medications on file prior to visit.  Has not taken any pyridium  Objective: BP 121/70  Pulse 66  Temp(Src) 98 F (36.7 C) (Oral)  Ht 6' (1.829 m)  Wt 214 lb (97.07 kg)  BMI 29.02 kg/m2 Gen: NAD, resting comfortably in chair, very pleasant and talkative CV: RRR no murmurs rubs or gallops Lungs: CTAB no crackles, wheeze, rhonchi Abd: soft/nontender Neuro: grossly normal, moves all extremities, 5/5 strength throughout lower extremity MSK:  Back - Normal skin but with several small lipomas noted on lower back, Spine with normal alignment and no deformity.  No tenderness to vertebral process palpation.  Paraspinous muscles are not tender and without spasm. Just above iliac crest patient has a tender spasmed muscle that is tender to  palpation.    Assessment/Plan:

## 2013-09-05 NOTE — Assessment & Plan Note (Addendum)
UA unremarkable today. Unclear etiology (doubt infectious) but suspect possible BPH given typical response to flomax. I have prescribed this again today and encouraged patient to follow up if not improving.

## 2013-09-05 NOTE — Assessment & Plan Note (Signed)
Seems that flexeril helped for last time he had a muscle spasm such as the one I am detecting today. Have encouraged flexeril use. This does not seem to be associated with LUTS symptoms as these were chronic and before the onset of the back pain. Ultimately if patient doesn't improve, suspect he will need a round of physical therapy.

## 2013-09-05 NOTE — Patient Instructions (Signed)
Your urine was completely normal. You do not have a urine infection.   I think you have strained a muscle in your back on the left and it is now really tight (spasming). I sent you in flexeril to help with this. If it does not improve over the next few weeks, please come see Dr. Konrad Dolores at your convenience.    For your nighttime peeing symptoms, you can take flomax as previously prescribed  Thanks, Dr. Nils Pyle also need: Health Maintenance Due  Topic Date Due  . Influenza Vaccine  05/25/2013

## 2013-10-14 ENCOUNTER — Encounter (HOSPITAL_BASED_OUTPATIENT_CLINIC_OR_DEPARTMENT_OTHER): Payer: Self-pay | Admitting: Emergency Medicine

## 2013-10-14 ENCOUNTER — Emergency Department (HOSPITAL_BASED_OUTPATIENT_CLINIC_OR_DEPARTMENT_OTHER)
Admission: EM | Admit: 2013-10-14 | Discharge: 2013-10-14 | Disposition: A | Discharge status: Home / Self Care | Payer: Medicare Other | Attending: Emergency Medicine | Admitting: Emergency Medicine

## 2013-10-14 DIAGNOSIS — Z86718 Personal history of other venous thrombosis and embolism: Secondary | ICD-10-CM | POA: Insufficient documentation

## 2013-10-14 DIAGNOSIS — N390 Urinary tract infection, site not specified: Secondary | ICD-10-CM

## 2013-10-14 DIAGNOSIS — I10 Essential (primary) hypertension: Secondary | ICD-10-CM | POA: Insufficient documentation

## 2013-10-14 DIAGNOSIS — Z79899 Other long term (current) drug therapy: Secondary | ICD-10-CM | POA: Insufficient documentation

## 2013-10-14 DIAGNOSIS — Z8709 Personal history of other diseases of the respiratory system: Secondary | ICD-10-CM | POA: Insufficient documentation

## 2013-10-14 DIAGNOSIS — Z8719 Personal history of other diseases of the digestive system: Secondary | ICD-10-CM | POA: Insufficient documentation

## 2013-10-14 DIAGNOSIS — Z8659 Personal history of other mental and behavioral disorders: Secondary | ICD-10-CM | POA: Insufficient documentation

## 2013-10-14 DIAGNOSIS — G8929 Other chronic pain: Secondary | ICD-10-CM | POA: Insufficient documentation

## 2013-10-14 LAB — URINALYSIS, ROUTINE W REFLEX MICROSCOPIC
Bilirubin Urine: NEGATIVE
Glucose, UA: NEGATIVE mg/dL
Hgb urine dipstick: NEGATIVE
Ketones, ur: NEGATIVE mg/dL
Leukocytes, UA: NEGATIVE
Specific Gravity, Urine: 1.017 (ref 1.005–1.030)
pH: 6.5 (ref 5.0–8.0)

## 2013-10-14 MED ORDER — SULFAMETHOXAZOLE-TMP DS 800-160 MG PO TABS: 1.0000 | ORAL_TABLET | Freq: Two times a day (BID) | ORAL | Status: DC

## 2013-10-14 NOTE — ED Provider Notes (Signed)
This chart was scribed for Zachary Maw Letha Mirabal, DO by Arlan Organ, ED Scribe. This patient was seen in room MH08/MH08 and the patient's care was started 5:28 PM.   TIME SEEN: 5:28 PM   CHIEF COMPLAINT: Urinary Frequency   HPI:   HPI Comments: Zachary [name] is a 52 y.o. Male with a h/o right leg DVT, chronic back pain, and HTN who presents to the Emergency Department complaining of gradual onset, unchanged, constant urinary frequency that initially started 2 days ago, but has worsened in the last 24 hours. He also reports left sided flank pain as an associated symptom and dysuria. He describes the pain as "sharp", and feels like "someone is pushing down on his abdomen". He says he has tried AZO from a previous UTI with no relief. Pt reports seeing a physician for these symptoms about 2-3 months ago and was treated with antibiotics. Pt says at that time, a CT was performed with no abnormal findings, no stones. He says he has been consuming a large amount of cranberry juice, water, and organic teas. He denies any other pertinent medical problems. Pt says he is currently sexually active with 1 woman, and does not currently use protection. He denies any nausea, vomiting, or diarrhea.  Has been treated 2x in 2 months for UTI.  Does not know the abx.   ROS: See HPI Constitutional: no fever  Eyes: no drainage  ENT: no runny nose   Cardiovascular:  no chest pain  Resp: no SOB  GI: no vomiting GU: + dysuria, positive for urinary frequency  Integumentary: no rash  Allergy: no hives  Musculoskeletal: no leg swelling, positive for flank pain Neurological: no slurred speech ROS otherwise negative  PAST MEDICAL HISTORY/PAST SURGICAL HISTORY:  Past Medical History  Diagnosis Date  . Right leg DVT august 2012  . Chronic back pain   . History of depression   . Diverticulosis 2006  . History of pneumothorax   . Hypertension   . Lipoma     MEDICATIONS:  Prior to Admission medications   Medication  Sig Start Date End Date Taking? Authorizing Provider  cyclobenzaprine (FLEXERIL) 5 MG tablet Take 1 tablet (5 mg total) by mouth 3 (three) times daily as needed for muscle spasms. 09/05/13  Yes Shelva Majestic, MD  phenazopyridine (PYRIDIUM) 100 MG tablet Take 1 tablet (100 mg total) by mouth 3 (three) times daily as needed for pain. 02/26/13  Yes Ozella Rocks, MD  tamsulosin (FLOMAX) 0.4 MG CAPS capsule Take 1 capsule (0.4 mg total) by mouth daily after supper. 09/05/13  Yes Shelva Majestic, MD    ALLERGIES:  No Known Allergies  SOCIAL HISTORY:  History  Substance Use Topics  . Smoking status: Never Smoker   . Smokeless tobacco: Never Used  . Alcohol Use: No    FAMILY HISTORY: Family History  Problem Relation Age of Onset  . Stroke Father   . Heart attack Mother   . Heart disease Mother     heart attack    EXAM: BP 130/75  Pulse 85  Temp(Src) 97.9 F (36.6 C) (Oral)  Resp 16  Wt 206 lb (93.441 kg)  SpO2 97% CONSTITUTIONAL: Alert and oriented and responds appropriately to questions. Well-appearing; well-nourished HEAD: Normocephalic EYES: Conjunctivae clear, PERRL ENT: normal nose; no rhinorrhea; moist mucous membranes; pharynx without lesions noted NECK: Supple, no meningismus, no LAD  CARD: RRR; S1 and S2 appreciated; no murmurs, no clicks, no rubs, no gallops RESP: Normal chest excursion  without splinting or tachypnea; breath sounds clear and equal bilaterally; no wheezes, no rhonchi, no rales,  ABD/GI: Normal bowel sounds; non-distended; soft, non-tender, no rebound, no guarding GU: normal exeternal genitalia, no penile dc, no testicular pain or swelling or scrotal masses, 2+ femoral pulses, no hernias, no subcutaneous gas or induration or erythema or warmth BACK:  The back appears normal and is non-tender to palpation, there is no CVA tenderness EXT: Normal ROM in all joints; non-tender to palpation; no edema; normal capillary refill; no cyanosis    SKIN: Normal  color for age and race; warm NEURO: Moves all extremities equally PSYCH: The patient's mood and manner are appropriate. Grooming and personal hygiene are appropriate.  MEDICAL DECISION MAKING:  Pt with nitrite + UTI.  Culture pending.  Otherwise well appearing with benign exam, normal VS.  WIll dc home on abx with Urology follow up info.  Pt verbalized understanding and is comfortable with plan.   I personally performed the services described in this documentation, which was scribed in my presence. The recorded information has been reviewed and is accurate.   Zachary Maw Ladarrian Asencio, DO 10/15/13 1906

## 2013-10-14 NOTE — ED Notes (Signed)
Pt reports urinary frequency x 2 days and left flank pain- has seen doctor for these sx 2-3 months ago and was started on meds for same. Has had CT scan and was given meds to treat

## 2013-10-15 ENCOUNTER — Encounter: Payer: Self-pay | Admitting: Family Medicine

## 2013-10-15 ENCOUNTER — Ambulatory Visit (INDEPENDENT_AMBULATORY_CARE_PROVIDER_SITE_OTHER): Payer: Medicare Other | Admitting: Family Medicine

## 2013-10-15 VITALS — BP 94/68 | HR 85 | Temp 98.3°F | Wt 212.0 lb

## 2013-10-15 DIAGNOSIS — N390 Urinary tract infection, site not specified: Secondary | ICD-10-CM | POA: Insufficient documentation

## 2013-10-15 DIAGNOSIS — R399 Unspecified symptoms and signs involving the genitourinary system: Secondary | ICD-10-CM

## 2013-10-15 DIAGNOSIS — N400 Enlarged prostate without lower urinary tract symptoms: Secondary | ICD-10-CM

## 2013-10-15 DIAGNOSIS — R3989 Other symptoms and signs involving the genitourinary system: Secondary | ICD-10-CM

## 2013-10-15 LAB — POCT URINALYSIS DIPSTICK
Bilirubin, UA: NEGATIVE
Blood, UA: NEGATIVE
Ketones, UA: NEGATIVE
Nitrite, UA: POSITIVE
Protein, UA: NEGATIVE
pH, UA: 7.5

## 2013-10-15 LAB — URINE CULTURE
Colony Count: NO GROWTH
Culture: NO GROWTH

## 2013-10-15 LAB — POCT UA - MICROSCOPIC ONLY

## 2013-10-15 MED ORDER — TAMSULOSIN HCL 0.4 MG PO CAPS: 0.8000 mg | ORAL_CAPSULE | Freq: Every day | ORAL | Status: DC

## 2013-10-15 MED ORDER — FINASTERIDE 5 MG PO TABS: 5.0000 mg | ORAL_TABLET | Freq: Every day | ORAL | Status: DC

## 2013-10-15 NOTE — Assessment & Plan Note (Signed)
Increase flomax Start finesteride Referral to Urology due to frequent UTI

## 2013-10-15 NOTE — Progress Notes (Signed)
Zachary [name] is a 52 y.o. male who presents to [facility] today for UTI f/u  UTI: 3rd UTI in past several months. On Bactrim this am. No change in pain. Pain in L flank. Denies fevers, dysuria. Endorses frequency. No hematuria. Urinating every 15 min. No change in symptoms.   Reviewed ED notes in full.   The following portions of the patient's history were reviewed and updated as appropriate: allergies, current medications, past medical history, family and social history, and problem list.  Patient is a nonsmoker.  Past Medical History  Diagnosis Date  . Right leg DVT august 2012  . Chronic back pain   . History of depression   . Diverticulosis 2006  . History of pneumothorax   . Hypertension   . Lipoma    ROS as above otherwise neg.    Medications reviewed. Current Outpatient Prescriptions  Medication Sig Dispense Refill  . cyclobenzaprine (FLEXERIL) 5 MG tablet Take 1 tablet (5 mg total) by mouth 3 (three) times daily as needed for muscle spasms.  30 tablet  0  . finasteride (PROSCAR) 5 MG tablet Take 1 tablet (5 mg total) by mouth daily.  30 tablet  3  . phenazopyridine (PYRIDIUM) 100 MG tablet Take 1 tablet (100 mg total) by mouth 3 (three) times daily as needed for pain.  30 tablet  0  . sulfamethoxazole-trimethoprim (BACTRIM DS) 800-160 MG per tablet Take 1 tablet by mouth 2 (two) times daily.  14 tablet  0  . tamsulosin (FLOMAX) 0.4 MG CAPS capsule Take 2 capsules (0.8 mg total) by mouth daily after supper.  180 capsule  3   No current facility-administered medications for this visit.    Exam:  BP 94/68  Pulse 85  Temp(Src) 98.3 F (36.8 C) (Oral)  Wt 212 lb (96.163 kg) Gen: Well NAD HEENT: EOMI,  MMM   Results for orders placed in visit on 10/15/13 (from the past 72 hour(s))  POCT URINALYSIS DIPSTICK     Status: Abnormal   Collection Time    10/15/13  3:44 PM      Result Value Range   Color, UA YELLOW     Clarity, UA CLEAR     Glucose, UA NEGATIVE     Bilirubin, UA NEGATIVE     Ketones, UA NEGATIVE     Spec Grav, UA 1.020     Blood, UA NEGATIVE     pH, UA 7.5     Protein, UA NEGATIVE     Urobilinogen, UA 0.2     Nitrite, UA POSITIVE     Leukocytes, UA Negative      A/P (as seen in Problem list)  BPH (benign prostatic hyperplasia) Increase flomax Start finesteride Referral to Urology due to frequent UTI  Recurrent UTI Likely secondary to BPH Referral to urology Continue bactrim until culture w/ sensitivities comes back Inc flomax and start finadteride

## 2013-10-15 NOTE — Patient Instructions (Signed)
You are doing well overall Please increase your flomax to 0.8mg  daily Please also start finasteride for your prostate Our offic ewill call you when they have secured an appointment.  We will call you if we need to change your antibiotics

## 2013-10-15 NOTE — Assessment & Plan Note (Signed)
Likely secondary to BPH Referral to urology Continue bactrim until culture w/ sensitivities comes back Inc flomax and start finadteride

## 2013-10-22 ENCOUNTER — Encounter: Payer: Self-pay | Admitting: Family Medicine

## 2013-10-22 ENCOUNTER — Ambulatory Visit (INDEPENDENT_AMBULATORY_CARE_PROVIDER_SITE_OTHER): Payer: Medicare Other | Admitting: Family Medicine

## 2013-10-22 VITALS — BP 107/71 | HR 79 | Temp 97.7°F | Wt 211.0 lb

## 2013-10-22 DIAGNOSIS — R3911 Hesitancy of micturition: Secondary | ICD-10-CM

## 2013-10-22 DIAGNOSIS — N390 Urinary tract infection, site not specified: Secondary | ICD-10-CM

## 2013-10-22 LAB — POCT URINALYSIS DIPSTICK
Blood, UA: NEGATIVE
Glucose, UA: NEGATIVE
Nitrite, UA: POSITIVE
Spec Grav, UA: 1.01
Urobilinogen, UA: 0.2

## 2013-10-22 LAB — POCT UA - MICROSCOPIC ONLY

## 2013-10-22 MED ORDER — CIPROFLOXACIN HCL 500 MG PO TABS: 500.0000 mg | ORAL_TABLET | Freq: Two times a day (BID) | ORAL | Status: DC

## 2013-10-22 NOTE — Patient Instructions (Signed)
Thank you for coming in today I'm concerned that you may have prostatitis.  Please come in for a urine sample and culture on Wednesday Start your antibiotics after giving [place] a urine sample on Wed. Prostatitis The prostate gland is about the size and shape of a walnut. It is located just below your bladder. It produces one of the components of semen, which is made up of sperm and the fluids that help nourish and transport it out from the testicles. Prostatitis is redness, soreness, and swelling (inflammation) of the prostate gland.  There are 3 types of prostatitis:  Acute bacterial prostatitis This is the least common type of prostatitis. It starts quickly and usually leads to a bladder infection. It can occur at any age.  Chronic bacterial prostatitis This is a persistent bacterial infection in the prostate. It usually develops from repeated acute bacterial prostatitis or acute bacterial prostatitis that was not properly treated. It can occur in men of any age but is most common in middle-aged men whose prostate has begun to enlarge.   Chronic prostatitis chronic pelvic pain syndrome This is the most common type of prostatitis. It is inflammation of the prostate gland that is not caused by a bacterial infection. The cause is unknown. CAUSES The cause of acute and chronic bacterial prostatitis is a bacterial infection. The exact cause of chronic prostatitis and chronic pelvic pain syndrome and asymptomatic inflammatory prostatitis is unknown.  SYMPTOMS  Symptoms can vary depending upon the type of prostatitis that exists. There can also be overlap in symptoms. Possible symptoms for each type of prostatitis are listed below. Acute bacterial prostatitis  Painful urination.  Fever or chills.  Muscle or joint pains.  Low back pain.  Low abdominal pain.  Inability to empty bladder completely.  Sudden urge to urinate.  Frequent urination.  Difficulty starting urine stream.  Weak urine  stream.  Discharge from the urethra.  Dribbling after urination.  Rectal pain.  Pain in the testicles, penis, or tip of the penis.  Pain in the space between the anus and scrotum (perineum).  Problems with sexual function.  Painful ejaculation.  Bloody semen. Chronic bacterial prostatitis  The symptoms are similar to those of acute bacterial prostatitis, but they usually are much less severe. Fever, chills, and muscle and joint pain are not associated with chronic bacterial prostatitis. Chronic prostatitis chronic pelvic pain syndrome  Symptoms typically include a dull ache in the scrotum and the perineum. DIAGNOSIS  In order to diagnose prostatitis, your caregiver will ask about your symptoms. If acute or chronic bacterial prostatitis is suspected, a urine sample will be taken and tested (urinalysis). This is to see if there is bacteria in your urine. If the urinalysis result is negative for bacteria, your caregiver may use a finger to feel your prostate (digital rectal exam). This exam helps your caregiver determine if your prostate is swollen and tender. TREATMENT  Treatment for prostatitis depends on the cause. If a bacterial infection is the cause, it can be treated with antibiotic medicine. In cases of chronic bacterial prostatitis, the use of antibiotics for up to 1 month may be necessary. Your caregiver may instruct you to take sitz baths to help relieve pain. A sitz bath is a bath of hot water in which your hips and buttocks are under water. HOME CARE INSTRUCTIONS   Take all medicines as directed by your caregiver.  Take sitz baths as directed by your caregiver. SEEK MEDICAL CARE IF:   Your symptoms  get worse, not better.  You have a fever. SEEK IMMEDIATE MEDICAL CARE IF:   You have chills.  You feel nauseous or vomit.  You feel lightheaded or faint.  You are unable to urinate.  You have blood or blood clots in your urine. Document Released: 10/08/2000  Document Revised: 01/03/2012 Document Reviewed: 04/30/2013 [facility] Patient Information 2014 [place], [place].

## 2013-10-22 NOTE — Progress Notes (Signed)
Zachary [name] is a 52 y.o. male who presents to [facility] today for urinary complaints  Urine: symptoms of fullness worsened on Saturday when driving to [place]. denies dysuria. Urinates every hour. Drinking large amounts of fluid. Feels bloated in lower abdomen. Large volume voids. Pyridium w/o benefit. Worse when sitting. Denies fevers. Stopped bactrim this am. Taking flomax  The following portions of the patient's history were reviewed and updated as appropriate: allergies, current medications, past medical history, family and social history, and problem list.  Past Medical History  Diagnosis Date  . Right leg DVT august 2012  . Chronic back pain   . History of depression   . Diverticulosis 2006  . History of pneumothorax   . Hypertension   . Lipoma     ROS as above otherwise neg.    Medications reviewed. Current Outpatient Prescriptions  Medication Sig Dispense Refill  . ciprofloxacin (CIPRO) 500 MG tablet Take 1 tablet (500 mg total) by mouth 2 (two) times daily.  60 tablet  0  . cyclobenzaprine (FLEXERIL) 5 MG tablet Take 1 tablet (5 mg total) by mouth 3 (three) times daily as needed for muscle spasms.  30 tablet  0  . finasteride (PROSCAR) 5 MG tablet Take 1 tablet (5 mg total) by mouth daily.  30 tablet  3  . phenazopyridine (PYRIDIUM) 100 MG tablet Take 1 tablet (100 mg total) by mouth 3 (three) times daily as needed for pain.  30 tablet  0  . sulfamethoxazole-trimethoprim (BACTRIM DS) 800-160 MG per tablet Take 1 tablet by mouth 2 (two) times daily.  14 tablet  0  . tamsulosin (FLOMAX) 0.4 MG CAPS capsule Take 2 capsules (0.8 mg total) by mouth daily after supper.  180 capsule  3   No current facility-administered medications for this visit.    Exam:  BP 107/71  Pulse 79  Temp(Src) 97.7 F (36.5 C) (Oral)  Wt 211 lb (95.709 kg) Gen: Well NAD HEENT: EOMI,  MMM Prostate: mildly uniformly enlarged and mildly ttp.  Results for orders placed in visit on 10/22/13 (from the  past 72 hour(s))  POCT URINALYSIS DIPSTICK     Status: Abnormal   Collection Time    10/22/13  3:15 PM      Result Value Range   Color, UA YELLOW     Clarity, UA CLEAR     Glucose, UA NEGATIVE     Bilirubin, UA NEGATIVE     Ketones, UA NEGATIVE     Spec Grav, UA 1.010     Blood, UA NEGATIVE     pH, UA 5.5     Protein, UA NEGATIVE     Urobilinogen, UA 0.2     Nitrite, UA POSITIVE     Leukocytes, UA Negative    POCT UA - MICROSCOPIC ONLY     Status: None   Collection Time    10/22/13  3:15 PM      Result Value Range   WBC, Ur, HPF, POC NONE     RBC, urine, microscopic OCCASIONAL     Bacteria, U Microscopic TRACE      A/P (as seen in Problem list)  Recurrent UTI Still w/ symptoms Nitrites in UA today Likely prostatitis given tender prostate Due to negative culture would prefer to obtain another UA and culture in 2 days after off ABX adn then start Cipro for 4 wk period   CT if acutely worsens

## 2013-10-22 NOTE — Assessment & Plan Note (Signed)
Still w/ symptoms Nitrites in UA today Likely prostatitis given tender prostate Due to negative culture would prefer to obtain another UA and culture in 2 days after off ABX adn then start Cipro for 4 wk period

## 2013-10-24 ENCOUNTER — Other Ambulatory Visit (INDEPENDENT_AMBULATORY_CARE_PROVIDER_SITE_OTHER): Payer: Medicare Other

## 2013-10-24 DIAGNOSIS — R300 Dysuria: Secondary | ICD-10-CM

## 2013-10-24 DIAGNOSIS — R3911 Hesitancy of micturition: Secondary | ICD-10-CM

## 2013-10-24 LAB — POCT UA - MICROSCOPIC ONLY

## 2013-10-24 LAB — POCT URINALYSIS DIPSTICK
Blood, UA: NEGATIVE
Protein, UA: NEGATIVE
Spec Grav, UA: >=1.03
Urobilinogen, UA: 0.2
pH, UA: 5.5

## 2013-10-24 NOTE — Progress Notes (Signed)
UA AND URINE CULTURE DONE TODAY [place] See 

## 2013-10-26 LAB — URINE CULTURE
Colony Count: NO GROWTH
Organism ID, Bacteria: NO GROWTH

## 2013-12-05 ENCOUNTER — Encounter: Payer: Self-pay | Admitting: Family Medicine

## 2013-12-05 ENCOUNTER — Ambulatory Visit (INDEPENDENT_AMBULATORY_CARE_PROVIDER_SITE_OTHER): Payer: Medicare Other | Admitting: Family Medicine

## 2013-12-05 VITALS — BP 113/75 | HR 79 | Temp 97.8°F | Wt 210.0 lb

## 2013-12-05 DIAGNOSIS — N400 Enlarged prostate without lower urinary tract symptoms: Secondary | ICD-10-CM

## 2013-12-05 DIAGNOSIS — N390 Urinary tract infection, site not specified: Secondary | ICD-10-CM

## 2013-12-05 DIAGNOSIS — R350 Frequency of micturition: Secondary | ICD-10-CM

## 2013-12-05 LAB — POCT URINALYSIS DIPSTICK
BILIRUBIN UA: NEGATIVE
Glucose, UA: NEGATIVE
KETONES UA: NEGATIVE
Leukocytes, UA: NEGATIVE
Nitrite, UA: NEGATIVE
PH UA: 5.5
PROTEIN UA: NEGATIVE
SPEC GRAV UA: 1.025
Urobilinogen, UA: 0.2

## 2013-12-05 LAB — POCT UA - MICROSCOPIC ONLY

## 2013-12-05 MED ORDER — FINASTERIDE 5 MG PO TABS: 5.0000 mg | ORAL_TABLET | Freq: Every day | ORAL | Status: DC

## 2013-12-05 MED ORDER — PHENAZOPYRIDINE HCL 100 MG PO TABS: 100.0000 mg | ORAL_TABLET | Freq: Three times a day (TID) | ORAL | Status: DC | PRN

## 2013-12-05 MED ORDER — TAMSULOSIN HCL 0.4 MG PO CAPS: 0.8000 mg | ORAL_CAPSULE | Freq: Every day | ORAL | Status: DC

## 2013-12-05 MED ORDER — CIPROFLOXACIN HCL 500 MG PO TABS: 500.0000 mg | ORAL_TABLET | Freq: Two times a day (BID) | ORAL | Status: DC

## 2013-12-05 NOTE — Assessment & Plan Note (Signed)
PSA nml from 2013  Restart flomax

## 2013-12-05 NOTE — Patient Instructions (Signed)
You are doing well overall. Your symptoms are improving but please continue taking the flomax, proscar, cipro, and pyridium I will call the urologist to try and get you in with a different doctor and a scope.  Please come back to see me in 4 weeks if you are no better.  If you develop diarrhea and abdominal pain call me immediately Please call me if you get worse and we will consider a CT scan.

## 2013-12-05 NOTE — Addendum Note (Signed)
Addended by: Martin[place], Eduardo Honor on: 12/05/2013 09:39 AM   Modules accepted: Orders

## 2013-12-05 NOTE — Progress Notes (Addendum)
Zachary [name] is a 53 y.o. male who presents to [facility] today for bladder pressure  Still w/ feelings of fullness in bladder adn in rectum. Better overall. Intercourse w/ wife that produced pain in testicles x 2. Sperm pink in color. Pt thought this was due to doxy so pt stopped. Last colonoscopy in [place] 5 years ago that showed diverticulosis and benign polyps. Went to GI doctor last year Deatra Ina who said no need for early colonoscopy. Off flomax and pyridium for 10 days. Stopped cipro yesterday.    The following portions of the patient's history were reviewed and updated as appropriate: allergies, current medications, past medical history, family and social history, and problem list.  Patient is a nonsmoker.   Health Maintenance:none  Past Medical History  Diagnosis Date  . Right leg DVT august 2012  . Chronic back pain   . History of depression   . Diverticulosis 2006  . History of pneumothorax   . Hypertension   . Lipoma     ROS as above otherwise neg.    Medications reviewed. Current Outpatient Prescriptions  Medication Sig Dispense Refill  . ciprofloxacin (CIPRO) 500 MG tablet Take 1 tablet (500 mg total) by mouth 2 (two) times daily.  60 tablet  0  . cyclobenzaprine (FLEXERIL) 5 MG tablet Take 1 tablet (5 mg total) by mouth 3 (three) times daily as needed for muscle spasms.  30 tablet  0  . finasteride (PROSCAR) 5 MG tablet Take 1 tablet (5 mg total) by mouth daily.  90 tablet  3  . phenazopyridine (PYRIDIUM) 100 MG tablet Take 1 tablet (100 mg total) by mouth 3 (three) times daily as needed for pain.  30 tablet  3  . sulfamethoxazole-trimethoprim (BACTRIM DS) 800-160 MG per tablet Take 1 tablet by mouth 2 (two) times daily.  14 tablet  0  . tamsulosin (FLOMAX) 0.4 MG CAPS capsule Take 2 capsules (0.8 mg total) by mouth daily after supper.  180 capsule  3   No current facility-administered medications for this visit.    Exam:  BP 113/75  Pulse 79  Temp(Src) 97.8 F  (36.6 C) (Oral)  Wt 210 lb (95.255 kg) Gen: Well NAD HEENT: EOMI,  MMM,    Results for orders placed in visit on 12/05/13 (from the past 72 hour(s))  POCT URINALYSIS DIPSTICK     Status: Abnormal   Collection Time    12/05/13  8:48 AM      Result Value Ref Range   Color, UA YELLOW     Clarity, UA CLEAR     Glucose, UA NEG     Bilirubin, UA NEG     Ketones, UA NEG     Spec Grav, UA 1.025     Blood, UA TRACE-LYSED     pH, UA 5.5     Protein, UA NEG     Urobilinogen, UA 0.2     Nitrite, UA NEG     Leukocytes, UA Negative    POCT UA - MICROSCOPIC ONLY     Status: None   Collection Time    12/05/13  8:48 AM      Result Value Ref Range   RBC, urine, microscopic 0-2     Bacteria, U Microscopic TRACE      A/P (as seen in Problem list)  Recurrent UTI Still w/ fullness sensation and rectal pressure - improved overall No UTI today on UA but w/ RBCs (never smoker). Some concern for bladder cancer Will call urology  to discuss scope Pt to restart flomax, Pyridium and cipro (treated for 4 wks but will continue for 8-12 wks)  Discussed warning signs of C.Diff Previous PSA nml  BPH (benign prostatic hyperplasia) PSA nml from 2013  Restart flomax  con proscar  Spent >25 min in direct pt care and coordination

## 2013-12-05 NOTE — Assessment & Plan Note (Addendum)
Still w/ fullness sensation and rectal pressure - improved overall No UTI today on UA but w/ RBCs (never smoker). Some concern for bladder cancer Will call urology to discuss scope Pt to restart flomax, Pyridium and cipro (treated for 4 wks but will continue for 8-12 wks)  Discussed warning signs of C.Diff Previous PSA nml

## 2013-12-12 ENCOUNTER — Telehealth: Payer: Self-pay | Admitting: Family Medicine

## 2013-12-12 ENCOUNTER — Encounter: Payer: Self-pay | Admitting: Family Medicine

## 2013-12-12 NOTE — Telephone Encounter (Signed)
[facility] and spoke to Dr. Delton Coombes nurse who will speak to Dr. Jasmine December concerning Mr Trevizo's case and possibility of f/u appt vs further studies (scope/imaging). Expecting call back to discuss plan  Linna Darner, MD Family Medicine PGY-3 12/12/2013, 11:15 AM

## 2013-12-12 NOTE — Progress Notes (Signed)
Pt informed and agreeable.  Will call Dr. Darol Destine office and schedule appt for late March. Caprice Mccaffrey, Salome Spotted

## 2013-12-12 NOTE — Progress Notes (Signed)
Spoke to Dr. Jasmine December of [facility]. Agrees w/ plan to continue Cipro for another 6 wks. Will consider scope after prostatitis resolves as procedure would be very painful under current condition. Pt did not make f/u appt at urology as he was instructed to do. Pt seen at urology on 11/21/13 and was instructed to make f/u appt in 2 mo. Pt needs to call their office to make appt.   Linna Darner, MD Family Medicine PGY-3 12/12/2013, 12:38 PM

## 2014-05-13 ENCOUNTER — Ambulatory Visit (INDEPENDENT_AMBULATORY_CARE_PROVIDER_SITE_OTHER): Payer: Medicare Other | Admitting: Family Medicine

## 2014-05-13 ENCOUNTER — Ambulatory Visit
Admission: RE | Admit: 2014-05-13 | Discharge: 2014-05-13 | Disposition: A | Discharge status: Home / Self Care | Payer: Medicare Other | Source: Ambulatory Visit | Attending: Family Medicine | Admitting: Family Medicine

## 2014-05-13 ENCOUNTER — Encounter: Payer: Self-pay | Admitting: Family Medicine

## 2014-05-13 VITALS — BP 123/86 | HR 86 | Temp 98.4°F | Ht 73.0 in | Wt 219.2 lb

## 2014-05-13 DIAGNOSIS — R05 Cough: Secondary | ICD-10-CM

## 2014-05-13 DIAGNOSIS — R059 Cough, unspecified: Secondary | ICD-10-CM

## 2014-05-13 MED ORDER — AZITHROMYCIN 250 MG PO TABS: ORAL_TABLET | ORAL | Status: DC

## 2014-05-13 MED ORDER — CEFTRIAXONE SODIUM 1 G IJ SOLR
1.0000 g | Freq: Once | INTRAMUSCULAR | Status: DC
  Administered 2014-05-13: 1 g via INTRAMUSCULAR

## 2014-05-13 NOTE — Progress Notes (Signed)
Patient ID: Zachary [name], male   DOB: 1961-04-13, 53 y.o.   MRN: [id number]  HPI:  Pt presents for a same day appointment to discuss cough.  Pt reports that he began having a sore throat yesterday morning, followed shortly by cough which is productive of dark sputum. Had fevers and chills at home, temp got as high as 39 degrees celsius (102.2 fahrenheit). Has used honey for cough, also advil. Eating and drinking okay. Wife is sick with similar symptoms. Had a lot of difficulty with sleeping overnight due to the cough. Has had some chest pain but only when coughing, not when walking around.  ROS: See HPI  Teresita: hx HLD, BPH  PHYSICAL EXAM: BP 123/86  Pulse 86  Temp(Src) 98.4 F (36.9 C) (Oral)  Ht 6\' 1"  (1.854 m)  Wt 219 lb 3.2 oz (99.428 kg)  BMI 28.93 kg/m2 Gen: NAD, pleasant and cooperative HEENT: NCAT, large tonsils without exudates. + small anterior cervical LAD. MMM Heart: RRR, no murmurs Lungs: normal respiratory effort, frequent cough. Decreased breath sounds in LUL area. Lungs otherwise clear with good air movement and no wheezes or rales. Frequent cough. Abdomen: soft, nontender to palpation, no appreciable organomegaly Neuro: grossly normal, normal spech Ext: No appreciable lower extremity edema bilaterally   ASSESSMENT/PLAN:  # Cough and fever: possibly viral URI but with relatively high fever and chills at home yesterday in setting of abrupt onset productive cough. Given this hx and decreased breath sounds in LUL, will go ahead and treat empirically for community acquired pneumonia. Pt ambulated in clinic without hypoxia (lowest O2 sat was 95% on room air). -Pt given ceftriaxone 1g IM here, and will be started on azithromycin 5 day course.  -obtain CXR (noting that CXR findings may lag behind clinical illness) -Supportive care with tylenol/ibuprofen, honey for cough, nasal saline spray -Return to clinic if not improving in the next few days  FOLLOW UP: F/u as  needed if symptoms worsen or do not improve.   [place]. Ardelia Mems, [place]

## 2014-05-13 NOTE — Patient Instructions (Signed)
It was nice to meet you today!  We are checking an xray today and treating you for possible pneumonia. I sent in an antibiotic (azithromycin) for you to take.  Continue using honey as needed for cough. You can also take mucinex which is over the counter. Use tylenol/ibuprofen as needed for fever.  Return to the clinic if you are not feeling better within the next few days.  Be well, Dr. Ardelia Mems

## 2014-05-13 NOTE — Addendum Note (Signed)
Addended by: Leonia Corona R on: 05/13/2014 11:48 AM   Modules accepted: Orders

## 2014-06-03 ENCOUNTER — Encounter: Payer: Self-pay | Admitting: Family Medicine

## 2014-06-03 ENCOUNTER — Ambulatory Visit (INDEPENDENT_AMBULATORY_CARE_PROVIDER_SITE_OTHER): Payer: Medicare Other | Admitting: Family Medicine

## 2014-06-03 VITALS — BP 119/71 | HR 73 | Temp 98.3°F | Ht 73.0 in | Wt 223.0 lb

## 2014-06-03 DIAGNOSIS — R059 Cough, unspecified: Secondary | ICD-10-CM

## 2014-06-03 DIAGNOSIS — R05 Cough: Secondary | ICD-10-CM

## 2014-06-03 MED ORDER — PANTOPRAZOLE SODIUM 40 MG PO TBEC: 40.0000 mg | DELAYED_RELEASE_TABLET | Freq: Every day | ORAL | Status: DC

## 2014-06-03 NOTE — Patient Instructions (Signed)
Great to meet you!  Try the neti pot  Come back in 2 weeks or so to see your PCP  Try the acid medicine, protonix  Cough, Adult  A cough is a reflex that helps clear your throat and airways. It can help heal the body or may be a reaction to an irritated airway. A cough may only last 2 or 3 weeks (acute) or may last more than 8 weeks (chronic).  CAUSES Acute cough:  Viral or bacterial infections. Chronic cough:  Infections.  Allergies.  Asthma.  Post-nasal drip.  Smoking.  Heartburn or acid reflux.  Some medicines.  Chronic lung problems (COPD).  Cancer. SYMPTOMS   Cough.  Fever.  Chest pain.  Increased breathing rate.  High-pitched whistling sound when breathing (wheezing).  Colored mucus that you cough up (sputum). TREATMENT   A bacterial cough may be treated with antibiotic medicine.  A viral cough must run its course and will not respond to antibiotics.  Your caregiver may recommend other treatments if you have a chronic cough. HOME CARE INSTRUCTIONS   Only take over-the-counter or prescription medicines for pain, discomfort, or fever as directed by your caregiver. Use cough suppressants only as directed by your caregiver.  Use a cold steam vaporizer or humidifier in your bedroom or home to help loosen secretions.  Sleep in a semi-upright position if your cough is worse at night.  Rest as needed.  Stop smoking if you smoke. SEEK IMMEDIATE MEDICAL CARE IF:   You have pus in your sputum.  Your cough starts to worsen.  You cannot control your cough with suppressants and are losing sleep.  You begin coughing up blood.  You have difficulty breathing.  You develop pain which is getting worse or is uncontrolled with medicine.  You have a fever. MAKE SURE YOU:   Understand these instructions.  Will watch your condition.  Will get help right away if you are not doing well or get worse. Document Released: 04/09/2011 Document Revised:  01/03/2012 Document Reviewed: 04/09/2011 [facility] Patient Information 2015 [place], [place]. This information is not intended to replace advice given to you by your health care provider. Make sure you discuss any questions you have with your health care provider.

## 2014-06-03 NOTE — Progress Notes (Signed)
Patient ID: Zachary [name], male   DOB: 1961/05/12, 53 y.o.   MRN: [id number]  Kenn File, MD Phone: [phone]  Subjective:  Chief complaint-noted  Pt Here for followup cough   patient states that he has had this cough for the last 3 months. Describes his cough as a dry nagging cough worsened by coughing. He's been taking honey and several over-the-counter medicines including Mucinex, with no improvement. He has tried Flonase in the past without improvement. He denies any sick contacts. He denies smoking or chronic cough besides this present episode.  He was previously seen approximately 3 weeks ago and treated with a course of azithromycin at that time. He was also given a shot of Rocephin then. He states that he had no improvement in his symptoms at that time. Chest x-ray at that time was normal  He states that he has previously seen ENT and told that he had a nasal/sinus deformity and was offered surgery. He feels that this is causing him to have severe postnasal drip which may be contributing to his symptoms.  He denies reflux symptoms, dyspnea, chest pain, leg swelling, fevers, decreased PO intake  The cough is interrupting his sleep and causing him to sore abd muscles.   ROS-  Per history of present illness  Past Medical History Patient Active Problem List   Diagnosis Date Noted  . Cough 06/03/2014  . BPH (benign prostatic hyperplasia) 10/15/2013  . Recurrent UTI 10/15/2013  . Pain of left calf 06/27/2013  . Urinary hesitancy 10/19/2012  . Back pain, chronic 08/16/2012  . Personal history of colonic polyps 08/09/2012  . Multiple lipomas 07/10/2012  . Dyslipidemia 07/02/2011    Medications- reviewed and updated Current Outpatient Prescriptions  Medication Sig Dispense Refill  . azithromycin (ZITHROMAX) 250 MG tablet Take two pills on day 1, followed by one pill each day for the next 4 days  6 tablet  0  . pantoprazole (PROTONIX) 40 MG tablet Take 1 tablet (40  mg total) by mouth daily.  30 tablet  3   No current facility-administered medications for this visit.    Objective: BP 119/71  Pulse 73  Temp(Src) 98.3 F (36.8 C) (Oral)  Ht 6\' 1"  (1.854 m)  Wt 223 lb (101.152 kg)  BMI 29.43 kg/m2 Gen: NAD, alert, cooperative with exam HEENT: NCAT, bilateral nares with slight swelling and bluish discoloration of his nasal mucosa, pharynx normal CV: RRR, good S1/S2, no murmur Resp: CTABL, no wheezes, non-labored Ext: No edema in symmetric, nontender, no Homans sign Neuro: Alert and oriented, No gross deficits   Assessment/Plan:  Cough Persistent cough after 3 months No resolution after course of azithromycin Consider pertussis, however azithromycin is an appropriate treatment for this Considering GERD, starting PPI Also recommended that he try neti pot with his concern for postnasal drip He states he has been offered ENT surgery, would like referral thinking this may be contributing to PND Keep high suspicion of PE with 2 previous DVTs, howvever no other symps today,  Followup PCP in 2 weeks   Meds ordered this encounter  Medications  . pantoprazole (PROTONIX) 40 MG tablet    Sig: Take 1 tablet (40 mg total) by mouth daily.    Dispense:  30 tablet    Refill:  3

## 2014-06-03 NOTE — Assessment & Plan Note (Addendum)
Persistent cough after 3 months No resolution after course of azithromycin Consider pertussis, however azithromycin is an appropriate treatment for this Considering GERD, starting PPI Also recommended that he try neti pot with his concern for postnasal drip He states he has been offered ENT surgery, would like referral thinking this may be contributing to PND Keep high suspicion of PE with 2 previous DVTs, howvever no other symps today,  Followup PCP in 2 weeks

## 2014-06-05 ENCOUNTER — Emergency Department (HOSPITAL_BASED_OUTPATIENT_CLINIC_OR_DEPARTMENT_OTHER)
Admission: EM | Admit: 2014-06-05 | Discharge: 2014-06-05 | Disposition: A | Discharge status: Home / Self Care | Payer: Medicare Other | Attending: Emergency Medicine | Admitting: Emergency Medicine

## 2014-06-05 ENCOUNTER — Encounter (HOSPITAL_BASED_OUTPATIENT_CLINIC_OR_DEPARTMENT_OTHER): Payer: Self-pay | Admitting: Emergency Medicine

## 2014-06-05 DIAGNOSIS — I10 Essential (primary) hypertension: Secondary | ICD-10-CM | POA: Insufficient documentation

## 2014-06-05 DIAGNOSIS — Z792 Long term (current) use of antibiotics: Secondary | ICD-10-CM | POA: Diagnosis not present

## 2014-06-05 DIAGNOSIS — Z86718 Personal history of other venous thrombosis and embolism: Secondary | ICD-10-CM | POA: Insufficient documentation

## 2014-06-05 DIAGNOSIS — Z8709 Personal history of other diseases of the respiratory system: Secondary | ICD-10-CM | POA: Insufficient documentation

## 2014-06-05 DIAGNOSIS — Z8719 Personal history of other diseases of the digestive system: Secondary | ICD-10-CM | POA: Insufficient documentation

## 2014-06-05 DIAGNOSIS — G8929 Other chronic pain: Secondary | ICD-10-CM | POA: Insufficient documentation

## 2014-06-05 DIAGNOSIS — M542 Cervicalgia: Secondary | ICD-10-CM | POA: Insufficient documentation

## 2014-06-05 DIAGNOSIS — Z79899 Other long term (current) drug therapy: Secondary | ICD-10-CM | POA: Insufficient documentation

## 2014-06-05 DIAGNOSIS — Z8659 Personal history of other mental and behavioral disorders: Secondary | ICD-10-CM | POA: Insufficient documentation

## 2014-06-05 DIAGNOSIS — M62838 Other muscle spasm: Secondary | ICD-10-CM | POA: Diagnosis not present

## 2014-06-05 MED ORDER — KETOROLAC TROMETHAMINE 60 MG/2ML IM SOLN
60.0000 mg | Freq: Once | INTRAMUSCULAR | Status: AC
  Administered 2014-06-05: 60 mg via INTRAMUSCULAR
  Filled 2014-06-05: qty 2

## 2014-06-05 MED ORDER — OXYCODONE-ACETAMINOPHEN 5-325 MG PO TABS: 1.0000 | ORAL_TABLET | ORAL | Status: DC | PRN

## 2014-06-05 MED ORDER — OXYCODONE-ACETAMINOPHEN 5-325 MG PO TABS
2.0000 | ORAL_TABLET | Freq: Once | ORAL | Status: AC
  Administered 2014-06-05: 2 via ORAL
  Filled 2014-06-05: qty 2

## 2014-06-05 MED ORDER — ALBUTEROL SULFATE HFA 108 (90 BASE) MCG/ACT IN AERS
2.0000 | INHALATION_SPRAY | Freq: Once | RESPIRATORY_TRACT | Status: AC
  Administered 2014-06-05: 2 via RESPIRATORY_TRACT
  Filled 2014-06-05: qty 6.7

## 2014-06-05 MED ORDER — NAPROXEN 500 MG PO TABS: 500.0000 mg | ORAL_TABLET | Freq: Two times a day (BID) | ORAL | Status: DC

## 2014-06-05 MED ORDER — DIAZEPAM 5 MG PO TABS
5.0000 mg | ORAL_TABLET | Freq: Once | ORAL | Status: AC
  Administered 2014-06-05: 5 mg via ORAL
  Filled 2014-06-05: qty 1

## 2014-06-05 MED ORDER — DIAZEPAM 5 MG PO TABS: 5.0000 mg | ORAL_TABLET | Freq: Three times a day (TID) | ORAL | Status: DC | PRN

## 2014-06-05 NOTE — ED Provider Notes (Signed)
CSN: [id number]     Arrival date & time 06/05/14  8416 History   First MD Initiated Contact with Patient 06/05/14 0732     Chief Complaint  Patient presents with  . Neck Pain     HPI Patient reports left neck pain over the past 2 days it became worse last night.  He states the pain is located in his left lateral neck and radiates down to his left trapezius region.  His pain is worse with movement of his head towards the left.  He's also had a cough for several weeks.  The vomit up with his primary care physician for that.  No shortness of breath.  No fevers or chills.  No recent injury or trauma.  Pain is moderate to severe in severity at this time.   Past Medical History  Diagnosis Date  . Right leg DVT august 2012  . Chronic back pain   . History of depression   . Diverticulosis 2006  . History of pneumothorax   . Hypertension   . Lipoma    Past Surgical History  Procedure Laterality Date  . Spinal steroid injections    . Chest tube insertion      for pneumothorax  . Inguinal hernia repair  2002, 2003    x2  . Cystectomy  2010, 2011  . Cholecystectomy  2005  . Lipoma excision  10/28/2011    Procedure: MINOR EXCISION LIPOMA;  Surgeon: Rolm Bookbinder, MD;  Location: [facility];  Service: General;  Laterality: N/A;  2 back lipoma excison  1 x 2 cm subcutaneous, 3 x4 cm subcutaneous  . Small intestine surgery     Family History  Problem Relation Age of Onset  . Stroke Father   . Heart attack Mother   . Heart disease Mother     heart attack   History  Substance Use Topics  . Smoking status: Never Smoker   . Smokeless tobacco: Never Used  . Alcohol Use: No    Review of Systems  All other systems reviewed and are negative.     Allergies  Review of patient's allergies indicates no known allergies.  Home Medications   Prior to Admission medications   Medication Sig Start Date End Date Taking? Authorizing Provider  azithromycin (ZITHROMAX) 250 MG  tablet Take two pills on day 1, followed by one pill each day for the next 4 days 05/13/14   Leeanne Rio, MD  pantoprazole (PROTONIX) 40 MG tablet Take 1 tablet (40 mg total) by mouth daily. 06/03/14   Timmothy Euler, MD   BP 124/87  Pulse 72  Temp(Src) 98.2 F (36.8 C) (Oral)  Resp 18  SpO2 97% Physical Exam  Nursing note and vitals reviewed. Constitutional: He is oriented to person, place, and time. He appears well-developed and well-nourished.  HENT:  Head: Normocephalic and atraumatic.  Eyes: EOM are normal.  Neck: Neck supple.  Mild tenderness and spasm noted of his left sternocleidomastoid muscle.  Patient with pain with range of motion with movement of his head towards the left.  Mild tenderness down into his left trapezius.  No overlying skin changes.  No midline C-spine tenderness.  Cardiovascular: Normal rate, regular rhythm, normal heart sounds and intact distal pulses.   Pulmonary/Chest: Effort normal and breath sounds normal. No respiratory distress.  Abdominal: Soft. He exhibits no distension. There is no tenderness.  Musculoskeletal: Normal range of motion.  5 out of 5 strength in bilateral upper extremity major  muscle groups  Neurological: He is alert and oriented to person, place, and time.  Skin: Skin is warm and dry.  Psychiatric: He has a normal mood and affect. Judgment normal.    ED Course  Procedures (including critical care time) Labs Review Labs Reviewed - No data to display  Imaging Review No results found.   EKG Interpretation None      MDM   Final diagnoses:  Muscle spasms of neck    8:59 AM This is to be sternocleidomastoid spasm.  Doubt cervical spine issue.  No indication for imaging.  No external signs of infection.  Improvement with anti-inflammatories, pain medicine, muscle relaxants in the ER.  Discharge home in good condition.  PCP followup.  He understands to return to the ER for new or worsening symptoms    Hoy Morn, MD 06/05/14 0900

## 2014-06-05 NOTE — ED Notes (Signed)
Neck pain x 3 days, worsening this am at 0300.  Painful to move neck, pain radiates to left ear.  Also reports sinus symptoms and cough x 3 weeks. He was seen 3 weeks ago for sinus and cough symptoms, given IM injection and CXR (neg).  Returned to PMD 2 days ago because he wasn't feeling better.

## 2014-06-05 NOTE — Discharge Instructions (Signed)

## 2014-06-17 ENCOUNTER — Encounter: Payer: Self-pay | Admitting: Pulmonary Disease

## 2014-06-17 ENCOUNTER — Ambulatory Visit: Payer: Medicare Other | Admitting: Pulmonary Disease

## 2014-06-17 VITALS — BP 124/78 | HR 76 | Temp 98.2°F | Ht 72.0 in | Wt 224.4 lb

## 2014-06-17 DIAGNOSIS — R05 Cough: Secondary | ICD-10-CM

## 2014-06-17 DIAGNOSIS — R059 Cough, unspecified: Secondary | ICD-10-CM

## 2014-06-17 MED ORDER — TRAMADOL HCL 50 MG PO TABS: 50.0000 mg | ORAL_TABLET | Freq: Two times a day (BID) | ORAL | Status: DC | PRN

## 2014-06-17 NOTE — Patient Instructions (Signed)
Start on dymista two sprays each nostril each am Avoid throat clearing as much as possible.  No singing/yelling/prolonged voice use. Use hard candy (no menthol or mint) all during the day to help with the tickle/irritation. Tramadol 50mg  one every 12 hrs if needed for tickling cough (#30). Increase protonix to twice a day for 2 weeks only. Keep apptm with Dr. Wilburn Cornelia Let me know if cough is not better in 2 weeks.

## 2014-06-17 NOTE — Progress Notes (Signed)
   Subjective:    Patient ID: Zachary [name], male    DOB: 03/16/1961, 53 y.o.   MRN: [id number]  HPI The patient comes in today for an acute sick visit with a chronic cough. I have seen him in the past, and did well with treatment for upper airway cough. He recently began coughing again in July, and was felt to have a pulmonary infection and treated with antibiotics. A chest x-ray at that time was unremarkable. He has continued to have a dry hacking cough despite antibiotics, and does feel significant sinus pressure on the left side of his face along with postnasal drip. He is not having any definite reflux, but does have a tickle in his throat that leads to continue his throat clearing. He is not having chest congestion or increased shortness of breath.   Review of Systems  Constitutional: Negative for fever and unexpected weight change.  HENT: Negative for congestion, dental problem, ear pain, nosebleeds, postnasal drip, rhinorrhea, sinus pressure, sneezing, sore throat and trouble swallowing.   Eyes: Negative for redness and itching.  Respiratory: Positive for cough. Negative for chest tightness, shortness of breath and wheezing.   Cardiovascular: Negative for palpitations and leg swelling.  Gastrointestinal: Negative for nausea and vomiting.  Genitourinary: Negative for dysuria.  Musculoskeletal: Negative for joint swelling.  Skin: Negative for rash.  Neurological: Negative for headaches.  Hematological: Does not bruise/bleed easily.  Psychiatric/Behavioral: Negative for dysphoric mood. The patient is not nervous/anxious.        Objective:   Physical Exam Well-developed male in no acute distress Nose without purulence or discharge noted, but there is a deviated septum to the left with near obstruction and swollen turbinates. Neck without lymphadenopathy or thyromegaly Chest totally clear to auscultation, no wheezing Cardiac exam with regular rate and rhythm Lower extremities  without edema, no cyanosis        Assessment & Plan:

## 2014-06-17 NOTE — Assessment & Plan Note (Signed)
The patient has a chronic cough but I suspect his upper airway in origin again. It is unclear how much of this is related to his known sinus disease, reflux, or simply a cyclical cough associated with the irritable larynx syndrome. I would like to treat for most of these entities, and also have reviewed with him behavioral therapies which helped prevent cyclical coughing. He has an appointment to see otolaryngology later this week, and I've asked him to keep this since he has a history of chronic sinus issues. He is to call me if he is not improving.

## 2014-10-25 HISTORY — PX: COLONOSCOPY: SHX174

## 2014-11-11 ENCOUNTER — Encounter: Payer: Self-pay | Admitting: Gastroenterology

## 2014-11-11 ENCOUNTER — Ambulatory Visit (INDEPENDENT_AMBULATORY_CARE_PROVIDER_SITE_OTHER): Payer: Medicare Other | Admitting: Gastroenterology

## 2014-11-11 VITALS — BP 112/72 | HR 76 | Ht 72.0 in | Wt 221.0 lb

## 2014-11-11 DIAGNOSIS — K6289 Other specified diseases of anus and rectum: Secondary | ICD-10-CM | POA: Insufficient documentation

## 2014-11-11 DIAGNOSIS — Z86010 Personal history of colonic polyps: Secondary | ICD-10-CM

## 2014-11-11 DIAGNOSIS — N509 Disorder of male genital organs, unspecified: Secondary | ICD-10-CM

## 2014-11-11 DIAGNOSIS — R102 Pelvic and perineal pain: Secondary | ICD-10-CM | POA: Insufficient documentation

## 2014-11-11 NOTE — Patient Instructions (Addendum)
You have been scheduled for a colonoscopy. Please follow written instructions given to you at your visit today.  Please pick up your prep kit at the pharmacy within the next 1-3 days. If you use inhalers (even only as needed), please bring them with you on the day of your procedure. Your physician has requested that you go to www.startemmi.com and enter the access code given to you at your visit today. This web site gives a general overview about your procedure. However, you should still follow specific instructions given to you by our office regarding your preparation for the procedure.   We will call you back with your referral appointment to urology We have given you a Suprep sample kit

## 2014-11-11 NOTE — Progress Notes (Signed)
_                                                                                                                History of Present Illness:  Zachary [name] is a 54 year old white male referred for evaluation of rectal discomfort.  For several weeks he's been complaining of urinary frequency and pain in the left flank and perineal area including his rectum.  He has been evaluated at urgent care where apparently blood was seen in the urine.  He was treated with what sounds like antibiotics without improvement in his symptoms.  Pain seems to be centered in the perineal area rather than the rectum, per se.  There has been no change in bowel habits or rectal bleeding.  Patient has a history of colon polyps that apparently were removed in 2010 in [place].  He denies dysuria.   Past Medical History  Diagnosis Date  . Right leg DVT august 2012  . Chronic back pain   . History of depression   . Diverticulosis 2006  . History of pneumothorax   . Hypertension   . Lipoma   . Rectal pain    Past Surgical History  Procedure Laterality Date  . Spinal steroid injections    . Chest tube insertion      for pneumothorax  . Inguinal hernia repair  2002, 2003    x2  . Cystectomy  2010, 2011  . Cholecystectomy  2005  . Lipoma excision  10/28/2011    Procedure: MINOR EXCISION LIPOMA;  Surgeon: Rolm Bookbinder, MD;  Location: [facility];  Service: General;  Laterality: N/A;  2 back lipoma excison  1 x 2 cm subcutaneous, 3 x4 cm subcutaneous  . Small intestine surgery     family history includes Heart attack in his mother; Heart disease in his mother; Stroke in his father. Current Outpatient Prescriptions  Medication Sig Dispense Refill  . diazepam (VALIUM) 5 MG tablet Take 1 tablet (5 mg total) by mouth every 8 (eight) hours as needed for muscle spasms. (Patient not taking: Reported on 11/11/2014) 12 tablet 0  . naproxen (NAPROSYN) 500 MG tablet Take 1 tablet (500 mg  total) by mouth 2 (two) times daily. (Patient not taking: Reported on 11/11/2014) 10 tablet 0  . oxyCODONE-acetaminophen (PERCOCET/ROXICET) 5-325 MG per tablet Take 1 tablet by mouth every 4 (four) hours as needed for severe pain. (Patient not taking: Reported on 11/11/2014) 15 tablet 0  . pantoprazole (PROTONIX) 40 MG tablet Take 1 tablet (40 mg total) by mouth daily. (Patient not taking: Reported on 11/11/2014) 30 tablet 3  . traMADol (ULTRAM) 50 MG tablet Take 1 tablet (50 mg total) by mouth every 12 (twelve) hours as needed. (Patient not taking: Reported on 11/11/2014) 30 tablet 0   No current facility-administered medications for this visit.   Allergies as of 11/11/2014  . (No Known Allergies)    reports that he has never smoked. He has never used smokeless tobacco. He reports that he does  not drink alcohol or use illicit drugs.   Review of Systems: Pertinent positive and negative review of systems were noted in the above HPI section. All other review of systems were otherwise negative.  Vital signs were reviewed in today's medical record Physical Exam: General: Well developed , well nourished, no acute distress Skin: anicteric Head: Normocephalic and atraumatic Eyes:  sclerae anicteric, EOMI Ears: Normal auditory acuity Mouth: No deformity or lesions Neck: Supple, no masses or thyromegaly Lungs: Clear throughout to auscultation Heart: Regular rate and rhythm; no murmurs, rubs or bruits Abdomen: Soft, non tender and non distended. No masses, hepatosplenomegaly or hernias noted. Normal Bowel sounds Rectal: There are no rectal masses.  Stool is Hemoccult negative Musculoskeletal: Symmetrical with no gross deformities  Skin: No lesions on visible extremities Pulses:  Normal pulses noted Extremities: No clubbing, cyanosis, edema or deformities noted Neurological: Alert oriented x 4, grossly nonfocal Cervical Nodes:  No significant cervical adenopathy Inguinal Nodes: No significant  inguinal adenopathy Psychological:  Alert and cooperative. Normal mood and affect  See Assessment and Plan under Problem List

## 2014-11-11 NOTE — Assessment & Plan Note (Signed)
Symptoms of perineal discomfort and severe urinary frequency suggest a urologic problems such as prostatitis.  Plan to refer to urology for further evaluation

## 2014-11-11 NOTE — Assessment & Plan Note (Signed)
Plan followup colonoscopy 

## 2014-11-12 ENCOUNTER — Telehealth: Payer: Self-pay | Admitting: *Deleted

## 2014-11-12 ENCOUNTER — Ambulatory Visit (AMBULATORY_SURGERY_CENTER): Payer: Medicare Other | Admitting: Gastroenterology

## 2014-11-12 ENCOUNTER — Encounter: Payer: Self-pay | Admitting: Gastroenterology

## 2014-11-12 VITALS — BP 101/67 | HR 65 | Temp 97.0°F | Resp 20 | Ht 72.0 in | Wt 221.0 lb

## 2014-11-12 DIAGNOSIS — Z86010 Personal history of colonic polyps: Secondary | ICD-10-CM

## 2014-11-12 DIAGNOSIS — D123 Benign neoplasm of transverse colon: Secondary | ICD-10-CM

## 2014-11-12 DIAGNOSIS — D124 Benign neoplasm of descending colon: Secondary | ICD-10-CM

## 2014-11-12 DIAGNOSIS — K5730 Diverticulosis of large intestine without perforation or abscess without bleeding: Secondary | ICD-10-CM

## 2014-11-12 MED ORDER — SODIUM CHLORIDE 0.9 % IV SOLN: 500.0000 mL | INTRAVENOUS | Status: DC

## 2014-11-12 NOTE — Telephone Encounter (Signed)
Okay, please let him know

## 2014-11-12 NOTE — Telephone Encounter (Signed)
Dr Deatra Ina,   [facility] will not let us do the referral on this patient, he is already a patient there and needs to contact the billing department

## 2014-11-12 NOTE — Patient Instructions (Signed)
YOU HAD AN ENDOSCOPIC PROCEDURE TODAY AT THE [facility]: Refer to the procedure report that was given to you for any specific questions about what was found during the examination.  If the procedure report does not answer your questions, please call your gastroenterologist to clarify.  If you requested that your care partner not be given the details of your procedure findings, then the procedure report has been included in a sealed envelope for you to review at your convenience later.  YOU SHOULD EXPECT: Some feelings of bloating in the abdomen. Passage of more gas than usual.  Walking can help get rid of the air that was put into your GI tract during the procedure and reduce the bloating. If you had a lower endoscopy (such as a colonoscopy or flexible sigmoidoscopy) you may notice spotting of blood in your stool or on the toilet paper. If you underwent a bowel prep for your procedure, then you may not have a normal bowel movement for a few days.  DIET: Your first meal following the procedure should be a light meal and then it is ok to progress to your normal diet.  A half-sandwich or bowl of soup is an example of a good first meal.  Heavy or fried foods are harder to digest and may make you feel nauseous or bloated.  Likewise meals heavy in dairy and vegetables can cause extra gas to form and this can also increase the bloating.  Drink plenty of fluids but you should avoid alcoholic beverages for 24 hours.  ACTIVITY: Your care partner should take you home directly after the procedure.  You should plan to take it easy, moving slowly for the rest of the day.  You can resume normal activity the day after the procedure however you should NOT DRIVE or use heavy machinery for 24 hours (because of the sedation medicines used during the test).    SYMPTOMS TO REPORT IMMEDIATELY: A gastroenterologist can be reached at any hour.  During normal business hours, 8:30 AM to 5:00 PM Monday through Friday,  call [phone].  After hours and on weekends, please call the GI answering service at [phone] who will take a message and have the physician on call contact you.   Following lower endoscopy (colonoscopy or flexible sigmoidoscopy):  Excessive amounts of blood in the stool  Significant tenderness or worsening of abdominal pains  Swelling of the abdomen that is new, acute  Fever of 100F or higher    FOLLOW UP: If any biopsies were taken you will be contacted by phone or by letter within the next 1-3 weeks.  Call your gastroenterologist if you have not heard about the biopsies in 3 weeks.  Our staff will call the home number listed on your records the next business day following your procedure to check on you and address any questions or concerns that you may have at that time regarding the information given to you following your procedure. This is a courtesy call and so if there is no answer at the home number and we have not heard from you through the emergency physician on call, we will assume that you have returned to your regular daily activities without incident.  SIGNATURES/CONFIDENTIALITY: You and/or your care partner have signed paperwork which will be entered into your electronic medical record.  These signatures attest to the fact that that the information above on your After Visit Summary has been reviewed and is understood.  Full responsibility of the confidentiality   of this discharge information lies with you and/or your care-partner.     

## 2014-11-12 NOTE — Progress Notes (Signed)
Report to PACU, RN, vss, BBS= Clear.  

## 2014-11-12 NOTE — Op Note (Signed)
[facility]  [address] [place], [id number]   COLONOSCOPY PROCEDURE REPORT  PATIENT: Zachary [name], Zachary [name]  MR#: [id number] BIRTHDATE: [birth year] , 53  yrs. old GENDER: male ENDOSCOPIST: Inda Castle, MD REFERRED BY: PROCEDURE DATE:  11/12/2014 PROCEDURE:   Colonoscopy with snare polypectomy First Screening Colonoscopy - Avg.  risk and is 50 yrs.  old or older - No.  Prior Negative Screening - Now for repeat screening. N/A  History of Adenoma - Now for follow-up colonoscopy & has been > or = to 3 yrs.  Yes hx of adenoma.  Has been 3 or more years since last colonoscopy.  Polyps Removed Today? Yes. ASA CLASS:   Class II INDICATIONS:high risk personal history of colonic polyps 2010. MEDICATIONS: Monitored anesthesia care and Propofol 300 mg IV  DESCRIPTION OF PROCEDURE:   After the risks benefits and alternatives of the procedure were thoroughly explained, informed consent was obtained.  The digital rectal exam revealed no abnormalities of the rectum.   The LB [id number]  endoscope was introduced through the anus and advanced to the cecum, which was identified by both the appendix and ileocecal valve. No adverse events experienced.   The quality of the prep was excellent using Suprep  The instrument was then slowly withdrawn as the colon was fully examined.      COLON FINDINGS: A sessile polyp measuring 10 mm in size was found at the splenic flexure.  A polypectomy was performed with a cold snare.  The resection was complete, the polyp tissue was completely retrieved and sent to histology.   A sessile polyp measuring 13 mm in size was found in the proximal descending colon. 2.5cc saline Injection was given to lift the mucosal wall.  A polypectomy was performed using snare cautery.  The resection was complete, the polyp tissue was completely retrieved and sent to histology.   A sessile polyp measuring 5 mm in size was found in the descending colon.  A  polypectomy was performed with a cold snare.  The resection was complete, the polyp tissue was completely retrieved and sent to histology.   There was moderate diverticulosis noted in the sigmoid colon.   There was mild diverticulosis noted in the transverse colon and descending colon.  Retroflexed views revealed no abnormalities. The time to cecum=4 minutes 34 seconds. Withdrawal time=15 minutes 36 seconds.  The scope was withdrawn and the procedure completed. COMPLICATIONS: There were no immediate complications.  ENDOSCOPIC IMPRESSION: 1.   Sessile polyp was found at the splenic flexure; polypectomy was performed with a cold snare 2.   Sessile polyp was found in the proximal descending colon; saline was given to lift the mucosal wall.; polypectomy was performed using snare cautery 3.   Sessile polyp was found in the descending colon; polypectomy was performed with a cold snare 4.   Moderate diverticulosis was noted in the sigmoid colon 5.   Mild diverticulosis was noted in the transverse colon and descending colon  RECOMMENDATIONS: If the polyp(s) removed today are proven to be adenomatous (pre-cancerous) polyps, you will need a colonoscopy in 3 years. Otherwise you should continue to follow colorectal cancer screening guidelines for "routine risk" patients with a colonoscopy in 10 years.  You will receive a letter within 1-2 weeks with the results of your biopsy as well as final recommendations.  Please call my office if you have not received a letter after 3 weeks.  eSigned:  Inda Castle, MD 11/12/2014 11:06 AM  cc: Tawanna Sat, MD   PATIENT NAME:  Zachary [name], Zachary [name] MR#: [id number]

## 2014-11-13 ENCOUNTER — Telehealth: Payer: Self-pay | Admitting: *Deleted

## 2014-11-13 NOTE — Telephone Encounter (Signed)
Patient needs to contact [facility] Billing department before he can be scheduled   Called No answer

## 2014-11-13 NOTE — Telephone Encounter (Signed)
  Follow up Call-  Call back number 11/12/2014  Post procedure Call Back phone  # [phone]  Permission to leave phone message Yes     Patient questions:  Do you have a fever, pain , or abdominal swelling? No. Pain Score  0 *  Have you tolerated food without any problems? Yes.    Have you been able to return to your normal activities? Yes.    Do you have any questions about your discharge instructions: Diet   No. Medications  No. Follow up visit  No.  Do you have questions or concerns about your Care? No.  Actions: * If pain score is 4 or above: No action needed, pain <4.

## 2014-11-20 ENCOUNTER — Encounter: Payer: Self-pay | Admitting: Gastroenterology

## 2014-11-27 ENCOUNTER — Encounter (HOSPITAL_COMMUNITY): Payer: Self-pay | Admitting: *Deleted

## 2014-11-27 ENCOUNTER — Emergency Department (HOSPITAL_COMMUNITY)
Admission: EM | Admit: 2014-11-27 | Discharge: 2014-11-27 | Disposition: A | Discharge status: Home / Self Care | Payer: Medicare Other | Attending: Emergency Medicine | Admitting: Emergency Medicine

## 2014-11-27 DIAGNOSIS — Z8709 Personal history of other diseases of the respiratory system: Secondary | ICD-10-CM | POA: Diagnosis not present

## 2014-11-27 DIAGNOSIS — Z8719 Personal history of other diseases of the digestive system: Secondary | ICD-10-CM | POA: Insufficient documentation

## 2014-11-27 DIAGNOSIS — R3915 Urgency of urination: Secondary | ICD-10-CM | POA: Insufficient documentation

## 2014-11-27 DIAGNOSIS — F329 Major depressive disorder, single episode, unspecified: Secondary | ICD-10-CM | POA: Diagnosis not present

## 2014-11-27 DIAGNOSIS — R319 Hematuria, unspecified: Secondary | ICD-10-CM | POA: Diagnosis present

## 2014-11-27 DIAGNOSIS — Z86718 Personal history of other venous thrombosis and embolism: Secondary | ICD-10-CM | POA: Diagnosis not present

## 2014-11-27 DIAGNOSIS — G8929 Other chronic pain: Secondary | ICD-10-CM | POA: Insufficient documentation

## 2014-11-27 DIAGNOSIS — Z872 Personal history of diseases of the skin and subcutaneous tissue: Secondary | ICD-10-CM | POA: Diagnosis not present

## 2014-11-27 DIAGNOSIS — M545 Low back pain: Secondary | ICD-10-CM | POA: Insufficient documentation

## 2014-11-27 DIAGNOSIS — R399 Unspecified symptoms and signs involving the genitourinary system: Secondary | ICD-10-CM

## 2014-11-27 LAB — CBC WITH DIFFERENTIAL/PLATELET
BASOS ABS: 0 10*3/uL (ref 0.0–0.1)
Basophils Relative: 0 % (ref 0–1)
EOS PCT: 2 % (ref 0–5)
Eosinophils Absolute: 0.1 10*3/uL (ref 0.0–0.7)
HCT: 42.6 % (ref 39.0–52.0)
HEMOGLOBIN: 15.3 g/dL (ref 13.0–17.0)
LYMPHS ABS: 3.1 10*3/uL (ref 0.7–4.0)
LYMPHS PCT: 45 % (ref 12–46)
MCH: 31.9 pg (ref 26.0–34.0)
MCHC: 35.9 g/dL (ref 30.0–36.0)
MCV: 88.9 fL (ref 78.0–100.0)
MONO ABS: 0.4 10*3/uL (ref 0.1–1.0)
MONOS PCT: 6 % (ref 3–12)
NEUTROS ABS: 3.3 10*3/uL (ref 1.7–7.7)
Neutrophils Relative %: 47 % (ref 43–77)
PLATELETS: 291 10*3/uL (ref 150–400)
RBC: 4.79 MIL/uL (ref 4.22–5.81)
RDW: 13.4 % (ref 11.5–15.5)
WBC: 6.9 10*3/uL (ref 4.0–10.5)

## 2014-11-27 LAB — URINALYSIS, ROUTINE W REFLEX MICROSCOPIC
BILIRUBIN URINE: NEGATIVE
GLUCOSE, UA: NEGATIVE mg/dL
Ketones, ur: NEGATIVE mg/dL
Leukocytes, UA: NEGATIVE
NITRITE: NEGATIVE
PH: 5.5 (ref 5.0–8.0)
Protein, ur: NEGATIVE mg/dL
Specific Gravity, Urine: 1.005 (ref 1.005–1.030)
UROBILINOGEN UA: 0.2 mg/dL (ref 0.0–1.0)

## 2014-11-27 LAB — COMPREHENSIVE METABOLIC PANEL
ALK PHOS: 73 U/L (ref 39–117)
ALT: 18 U/L (ref 0–53)
ANION GAP: 7 (ref 5–15)
AST: 29 U/L (ref 0–37)
Albumin: 4.2 g/dL (ref 3.5–5.2)
BILIRUBIN TOTAL: 0.3 mg/dL (ref 0.3–1.2)
BUN: 13 mg/dL (ref 6–23)
CALCIUM: 8.7 mg/dL (ref 8.4–10.5)
CO2: 24 mmol/L (ref 19–32)
Chloride: 105 mmol/L (ref 96–112)
Creatinine, Ser: 1.11 mg/dL (ref 0.50–1.35)
GFR calc non Af Amer: 74 mL/min — ABNORMAL LOW (ref >90)
GFR, EST AFRICAN AMERICAN: 85 mL/min — AB (ref >90)
GLUCOSE: 117 mg/dL — AB (ref 70–99)
POTASSIUM: 3.9 mmol/L (ref 3.5–5.1)
SODIUM: 136 mmol/L (ref 135–145)
Total Protein: 6.8 g/dL (ref 6.0–8.3)

## 2014-11-27 LAB — URINE MICROSCOPIC-ADD ON

## 2014-11-27 MED ORDER — PHENAZOPYRIDINE HCL 200 MG PO TABS: 200.0000 mg | ORAL_TABLET | Freq: Three times a day (TID) | ORAL | Status: DC | PRN

## 2014-11-27 NOTE — ED Notes (Signed)
The pt is c/o lower back pain with painful urination   Bloody urine and urineay frequency  For one month.  More urinary frequency for the past 3-4 days.  He has had a workup for the same  And a colonsocopy  In the past month.

## 2014-11-27 NOTE — ED Notes (Signed)
EDP at bedside  

## 2014-11-27 NOTE — Discharge Instructions (Signed)
It was our pleasure to provide your ER care today - we hope that you feel better.  Drink plenty of water/fluids.  You may try taking pyridium as need for symptom relief.  Follow up with urologist in the next 1-2 weeks - see referral - call office to arrange follow up appointment.  Return to ER if worse, new symptoms, fevers, severe abdominal pain, unable to void, other concern.

## 2014-11-27 NOTE — ED Provider Notes (Signed)
CSN: [id number]     Arrival date & time 11/27/14  1756 History   First MD Initiated Contact with Patient 11/27/14 1954     Chief Complaint  Patient presents with  . Hematuria     (Consider location/radiation/quality/duration/timing/severity/associated sxs/prior Treatment) Patient is a 54 y.o. male presenting with hematuria. The history is provided by the patient.  Hematuria Pertinent negatives include no chest pain, no abdominal pain, no headaches and no shortness of breath.  pt with hx recurrent urinary symptoms for years,  C/o urinary urgency, frequency at times. Also notes intermittent left low back pain. Hx chronic back pain and ddd. No urinary retention or incontinence. No abd pain. No nvd. Had normal bm today. No penile discharge or dysuria. No scrotal or testicular pain or swelling. No fever or chills. States had pcp and gi eval within past year for same, neg for acute process. No wt loss. Does feel as if able to empty bladder fully.      Past Medical History  Diagnosis Date  . Right leg DVT august 2012  . Chronic back pain   . History of depression   . Diverticulosis 2006  . History of pneumothorax   . Lipoma   . Rectal pain   . Depression    Past Surgical History  Procedure Laterality Date  . Spinal steroid injections    . Chest tube insertion      for pneumothorax  . Inguinal hernia repair  2002, 2003    x2  . Cystectomy  2010, 2011  . Cholecystectomy  2005  . Lipoma excision  10/28/2011    Procedure: MINOR EXCISION LIPOMA;  Surgeon: Rolm Bookbinder, MD;  Location: [facility];  Service: General;  Laterality: N/A;  2 back lipoma excison  1 x 2 cm subcutaneous, 3 x4 cm subcutaneous  . Small intestine surgery     Family History  Problem Relation Age of Onset  . Stroke Father   . Heart attack Mother   . Heart disease Mother     heart attack  . Colon cancer Neg Hx   . Stomach cancer Neg Hx    History  Substance Use Topics  . Smoking status:  Never Smoker   . Smokeless tobacco: Never Used  . Alcohol Use: No    Review of Systems  Constitutional: Negative for fever and chills.  HENT: Negative for sore throat.   Eyes: Negative for redness.  Respiratory: Negative for cough and shortness of breath.   Cardiovascular: Negative for chest pain.  Gastrointestinal: Negative for vomiting and abdominal pain.  Genitourinary: Positive for urgency and hematuria. Negative for flank pain.  Musculoskeletal: Positive for back pain. Negative for neck pain.  Skin: Negative for rash.  Neurological: Negative for headaches.  Hematological: Does not bruise/bleed easily.  Psychiatric/Behavioral: Negative for confusion.      Allergies  Review of patient's allergies indicates no known allergies.  Home Medications   Prior to Admission medications   Medication Sig Start Date End Date Taking? Authorizing Provider  diazepam (VALIUM) 5 MG tablet Take 1 tablet (5 mg total) by mouth every 8 (eight) hours as needed for muscle spasms. Patient not taking: Reported on 11/11/2014 06/05/14   Hoy Morn, MD  naproxen (NAPROSYN) 500 MG tablet Take 1 tablet (500 mg total) by mouth 2 (two) times daily. Patient not taking: Reported on 11/12/2014 06/05/14   Hoy Morn, MD  oxyCODONE-acetaminophen (PERCOCET/ROXICET) 5-325 MG per tablet Take 1 tablet by mouth every 4 (  four) hours as needed for severe pain. Patient not taking: Reported on 11/12/2014 06/05/14   Hoy Morn, MD  pantoprazole (PROTONIX) 40 MG tablet Take 1 tablet (40 mg total) by mouth daily. Patient not taking: Reported on 11/11/2014 06/03/14   Timmothy Euler, MD  traMADol (ULTRAM) 50 MG tablet Take 1 tablet (50 mg total) by mouth every 12 (twelve) hours as needed. Patient not taking: Reported on 11/11/2014 06/17/14   Kathee Delton, MD   BP 109/69 mmHg  Pulse 63  Temp(Src) 98 F (36.7 C)  Resp 16  SpO2 98% Physical Exam  Constitutional: He is oriented to person, place, and time. He  appears well-developed and well-nourished. No distress.  HENT:  Mouth/Throat: Oropharynx is clear and moist.  Eyes: Conjunctivae are normal. No scleral icterus.  Neck: Neck supple. No tracheal deviation present.  Cardiovascular: Normal rate.   Pulmonary/Chest: Effort normal. No accessory muscle usage. No respiratory distress.  Abdominal: Soft. Bowel sounds are normal. He exhibits no distension and no mass. There is no tenderness. There is no rebound and no guarding.  Genitourinary:  No cva tenderness. No scrotal or testicular pain, swelling or tenderness. No hernia. No penile discharge.   Musculoskeletal: Normal range of motion. He exhibits no edema.  tls spine non tender, aligned.   Neurological: He is alert and oriented to person, place, and time.  Skin: Skin is warm and dry. No rash noted. He is not diaphoretic.  Psychiatric: He has a normal mood and affect.  Nursing note and vitals reviewed.   ED Course  Procedures (including critical care time) Labs Review   Results for orders placed or performed during the hospital encounter of 11/27/14  CBC with Differential  Result Value Ref Range   WBC 6.9 4.0 - 10.5 K/uL   RBC 4.79 4.22 - 5.81 MIL/uL   Hemoglobin 15.3 13.0 - 17.0 g/dL   HCT 42.6 39.0 - 52.0 %   MCV 88.9 78.0 - 100.0 fL   MCH 31.9 26.0 - 34.0 pg   MCHC 35.9 30.0 - 36.0 g/dL   RDW 13.4 11.5 - 15.5 %   Platelets 291 150 - 400 K/uL   Neutrophils Relative % 47 43 - 77 %   Neutro Abs 3.3 1.7 - 7.7 K/uL   Lymphocytes Relative 45 12 - 46 %   Lymphs Abs 3.1 0.7 - 4.0 K/uL   Monocytes Relative 6 3 - 12 %   Monocytes Absolute 0.4 0.1 - 1.0 K/uL   Eosinophils Relative 2 0 - 5 %   Eosinophils Absolute 0.1 0.0 - 0.7 K/uL   Basophils Relative 0 0 - 1 %   Basophils Absolute 0.0 0.0 - 0.1 K/uL  Comprehensive metabolic panel  Result Value Ref Range   Sodium 136 135 - 145 mmol/L   Potassium 3.9 3.5 - 5.1 mmol/L   Chloride 105 96 - 112 mmol/L   CO2 24 19 - 32 mmol/L    Glucose, Bld 117 (H) 70 - 99 mg/dL   BUN 13 6 - 23 mg/dL   Creatinine, Ser 1.11 0.50 - 1.35 mg/dL   Calcium 8.7 8.4 - 10.5 mg/dL   Total Protein 6.8 6.0 - 8.3 g/dL   Albumin 4.2 3.5 - 5.2 g/dL   AST 29 0 - 37 U/L   ALT 18 0 - 53 U/L   Alkaline Phosphatase 73 39 - 117 U/L   Total Bilirubin 0.3 0.3 - 1.2 mg/dL   GFR calc non Af Amer 74 (L) >  90 mL/min   GFR calc Af Amer 85 (L) >90 mL/min   Anion gap 7 5 - 15  Urinalysis, Routine w reflex microscopic  Result Value Ref Range   Color, Urine YELLOW YELLOW   APPearance CLEAR CLEAR   Specific Gravity, Urine 1.005 1.005 - 1.030   pH 5.5 5.0 - 8.0   Glucose, UA NEGATIVE NEGATIVE mg/dL   Hgb urine dipstick TRACE (A) NEGATIVE   Bilirubin Urine NEGATIVE NEGATIVE   Ketones, ur NEGATIVE NEGATIVE mg/dL   Protein, ur NEGATIVE NEGATIVE mg/dL   Urobilinogen, UA 0.2 0.0 - 1.0 mg/dL   Nitrite NEGATIVE NEGATIVE   Leukocytes, UA NEGATIVE NEGATIVE  Urine microscopic-add on  Result Value Ref Range   Squamous Epithelial / LPF RARE RARE   WBC, UA 0-2 <3 WBC/hpf   RBC / HPF 0-2 <3 RBC/hpf   Bacteria, UA RARE RARE     MDM   Reviewed nursing notes and prior charts for additional history.   Reviewed prior labs/cts here, and in [facility] - pt w multiple abd/pelvis cts, and multiple ct chest neg for acute process.  Pt afeb. No indication acute infectious process.  Pt mentions rx for medication for bladder/bladder spasm - states he took a medication in remote past that turned urine orange/red, that seemed to help symptoms.  ?pyridium, will give limited rx for symptom relief.  Given recurrent/chronic nature gu symptoms, discussed referral to urology.  Pt currently appears stable for d/c.   Return precautions discussed.       Mirna Mires, MD 11/27/14 [phone]

## 2014-11-28 LAB — URINE CULTURE
Colony Count: NO GROWTH
Culture: NO GROWTH

## 2015-01-09 ENCOUNTER — Telehealth: Payer: Self-pay | Admitting: Gastroenterology

## 2015-01-09 NOTE — Telephone Encounter (Signed)
Patient states his urinary issue has been treated, but he continues to have rectal pressure and pain. Normal bowel movements. Wants to be re-evaluated. He is okay with seeing a male provider. Appointment scheduled.

## 2015-01-13 ENCOUNTER — Encounter: Payer: Self-pay | Admitting: Physician Assistant

## 2015-01-13 ENCOUNTER — Ambulatory Visit (INDEPENDENT_AMBULATORY_CARE_PROVIDER_SITE_OTHER): Payer: Medicare Other | Admitting: Physician Assistant

## 2015-01-13 VITALS — BP 100/60 | HR 80 | Ht 71.0 in | Wt 212.8 lb

## 2015-01-13 DIAGNOSIS — N509 Disorder of male genital organs, unspecified: Secondary | ICD-10-CM | POA: Diagnosis not present

## 2015-01-13 DIAGNOSIS — R102 Pelvic and perineal pain: Secondary | ICD-10-CM

## 2015-01-13 DIAGNOSIS — K6289 Other specified diseases of anus and rectum: Secondary | ICD-10-CM

## 2015-01-13 DIAGNOSIS — N411 Chronic prostatitis: Secondary | ICD-10-CM | POA: Diagnosis not present

## 2015-01-13 NOTE — Patient Instructions (Addendum)
  You have been scheduled for a CT scan of the abdomen and pelvis at [facility] ([address]---this is in the same building as [facility]).   You are scheduled on Tuesday 01-16-2015 at 4:00 PM. You should arrive at 3:45 PM  prior to your appointment time for registration. Please follow the written instructions below on the day of your exam:  WARNING: IF YOU ARE ALLERGIC TO IODINE/X-RAY DYE, PLEASE NOTIFY RADIOLOGY IMMEDIATELY AT [phone]! YOU WILL BE GIVEN A 13 HOUR PREMEDICATION PREP.  1) Do not eat or drink anything after 10:00 am  (4 hours prior to your test) 2) You have been given 2 bottles of oral contrast to drink. The solution may taste better if refrigerated, but do NOT add ice or any other liquid to this solution. Shake well before drinking.    Drink 1 bottle of contrast @ 2:00 PM  (2 hours prior to your exam)   You may take any medications as prescribed with a small amount of water except for the following: Metformin, Glucophage, Glucovance, Avandamet, Riomet, Fortamet, Actoplus Met, Janumet, Glumetza or Metaglip. The above medications must be held the day of the exam AND 48 hours after the exam.  The purpose of you drinking the oral contrast is to aid in the visualization of your intestinal tract. The contrast solution may cause some diarrhea. Before your exam is started, you will be given a small amount of fluid to drink. Depending on your individual set of symptoms, you may also receive an intravenous injection of x-ray contrast/dye. Plan on being at [facility] for 30 minutes or long, depending on the type of exam you are having performed.  If you have any questions regarding your exam or if you need to reschedule, you may call the CT department at [phone] between the hours of 8:00 am and 5:00 pm, Monday-Friday.  ________________________________________________________________________

## 2015-01-13 NOTE — Progress Notes (Signed)
Patient ID: Zachary [name], male   DOB: August 11, 1961, 54 y.o.   MRN: [id number]   Subjective:    Patient ID: Zachary [name], male    DOB: October 12, 1961, 54 y.o.   MRN: [id number]  HPI Zachary [name] is a pleasant 54 year old male who has been undergoing evaluation for rectal and pelvic pressure/pain. He had seen Dr. Deatra Ina in January 2016 and at that time had been having constant pressure in the peroneal and rectal area over the past 2 months. He was scheduled for colonoscopy which was done on 11/12/2014 this showed 3 sessile polyps the largest 10 mm all of which were removed and were tubular adenomas. He also has moderate diverticulosis there was no evidence of internal or external hemorrhoids. He was then referred to [facility]. We did not have those records at the time of visit today but he states that he was told that he might have some prostatitis. He was not given any antibiotics but was given a medication which was going to be $400 which she could not afford. He has not gone back for follow-up. He is taking Flomax. His symptoms have not changed he has not seen any recent hematuria. He says he has a constant pressure sensation in the peroneal and rectal area which is worse with sitting in bed better with walking. Not having any problems with constipation but says this may be a bit increased if he does have a hard stool. He says occasionally he gets some pressure sensation in his. Lower abdomen as well. He does have history of complicated diverticulitis and is status post partial colectomy in 2006.  Review of Systems Pertinent positive and negative review of systems were noted in the above HPI section.  All other review of systems was otherwise negative.  Outpatient Encounter Prescriptions as of 01/13/2015  Medication Sig  . phenazopyridine (PYRIDIUM) 200 MG tablet Take 200 mg by mouth 3 (three) times daily as needed for pain.  . tamsulosin (FLOMAX) 0.4 MG CAPS capsule Take 0.4 mg by mouth daily.  .  [DISCONTINUED] diazepam (VALIUM) 5 MG tablet Take 1 tablet (5 mg total) by mouth every 8 (eight) hours as needed for muscle spasms. (Patient not taking: Reported on 11/11/2014)  . [DISCONTINUED] naproxen (NAPROSYN) 500 MG tablet Take 1 tablet (500 mg total) by mouth 2 (two) times daily. (Patient not taking: Reported on 11/12/2014)  . [DISCONTINUED] oxyCODONE-acetaminophen (PERCOCET/ROXICET) 5-325 MG per tablet Take 1 tablet by mouth every 4 (four) hours as needed for severe pain. (Patient not taking: Reported on 11/12/2014)  . [DISCONTINUED] pantoprazole (PROTONIX) 40 MG tablet Take 1 tablet (40 mg total) by mouth daily. (Patient not taking: Reported on 11/11/2014)  . [DISCONTINUED] phenazopyridine (PYRIDIUM) 200 MG tablet Take 1 tablet (200 mg total) by mouth 3 (three) times daily as needed for pain. (Patient not taking: Reported on 01/13/2015)  . [DISCONTINUED] traMADol (ULTRAM) 50 MG tablet Take 1 tablet (50 mg total) by mouth every 12 (twelve) hours as needed. (Patient not taking: Reported on 11/11/2014)   No Known Allergies Patient Active Problem List   Diagnosis Date Noted  . Chronic prostatitis 01/13/2015  . Perineal pain in male 11/11/2014  . Rectal pain   . Cough 06/03/2014  . BPH (benign prostatic hyperplasia) 10/15/2013  . Recurrent UTI 10/15/2013  . Pain of left calf 06/27/2013  . Urinary hesitancy 10/19/2012  . Back pain, chronic 08/16/2012  . History of colonic polyps 08/09/2012  . Multiple lipomas 07/10/2012  . Dyslipidemia 07/02/2011   History  Social History  . Marital Status: Married    Spouse Name: N/A  . Number of Children: 3  . Years of Education: N/A   Occupational History  . Surveyor, [employer]    Social History Main Topics  . Smoking status: Never Smoker   . Smokeless tobacco: Never Used  . Alcohol Use: No  . Drug Use: No  . Sexual Activity: Not on file   Other Topics Concern  . Not on file   Social History Narrative   Married with 3 children ages 37, 3  and 25.  Originally from [place].  On disability 2/2 to his chronic back pain. Denies tobacco use.  Drinks 2-3 glasses of wine per day.  Denies illicit drugs.    Zachary [name] family history includes Heart attack in his mother; Heart disease in his mother; Stroke in his father. There is no history of Colon cancer or Stomach cancer.      Objective:    Filed Vitals:   01/13/15 1333  BP: 100/60  Pulse: 80    Physical Exam  well-developed white male in no acute distress, pleasant blood pressure 100/60 pulse 80 height 5 foot 11 weight 212HEENT; nontraumatic normocephalic EOMI PERRLA sclera anicteric, Supple; no JVD, Cardiovascular; regular rate and rhythm with S1-S2 no murmur or gallop, Pulmonary; clear bilaterally, Abdomen ;soft nontender nondistended bowel sounds are active he has a low midline incisional scar no palpable mass or hepatosplenomegaly, Rectal; exam not done patient just had colonoscopy, Extremities; no clubbing cyanosis or edema skin warm and dry, Psych; mood and affect normal and appropriate       Assessment & Plan:   #1 54 yo male with several month hx of constant rectal/perineal pressure . I do not think his pain is GI in etiology. Suspect chronic proststitis. Will also R/O other pelvic ,sacral coccygeal etiology. #2 adenomatous colon polyps - will need follow up in 5 years  #3 chronic back pain  Plan; Will schedule for pelvic CT  Records from Urology were requested and reviewed after pt left appt- per Dr. Risa Grill he is felt to have chronic prostatitis /chronic pelvic pain syndrome  And start Rapaflo and Uribel, and follow up in 4-6 weeks. If Ct unrevealing will get him back to Urology- may have been some language barrier.   Amy S Esterwood PA-C 01/13/2015   Cc: Leone Brand, MD

## 2015-01-13 NOTE — Progress Notes (Signed)
Reviewed and agree with management. Myria Steenbergen D. Kacia Halley, M.D., FACG  

## 2015-01-14 ENCOUNTER — Other Ambulatory Visit: Payer: Medicare Other

## 2015-01-16 ENCOUNTER — Inpatient Hospital Stay: Admission: RE | Admit: 2015-01-16 | Payer: Medicare Other | Source: Ambulatory Visit

## 2015-01-16 ENCOUNTER — Other Ambulatory Visit: Payer: Medicare Other

## 2015-01-20 ENCOUNTER — Ambulatory Visit (INDEPENDENT_AMBULATORY_CARE_PROVIDER_SITE_OTHER)
Admission: RE | Admit: 2015-01-20 | Discharge: 2015-01-20 | Disposition: A | Discharge status: Home / Self Care | Payer: Medicare Other | Source: Ambulatory Visit | Attending: Physician Assistant | Admitting: Physician Assistant

## 2015-01-20 DIAGNOSIS — R102 Pelvic and perineal pain: Secondary | ICD-10-CM

## 2015-01-20 DIAGNOSIS — K6289 Other specified diseases of anus and rectum: Secondary | ICD-10-CM

## 2015-01-20 DIAGNOSIS — N509 Disorder of male genital organs, unspecified: Secondary | ICD-10-CM | POA: Diagnosis not present

## 2015-01-20 MED ORDER — IOHEXOL 300 MG/ML  SOLN
100.0000 mL | Freq: Once | INTRAMUSCULAR | Status: AC | PRN
  Administered 2015-01-20: 100 mL via INTRAVENOUS

## 2015-03-14 ENCOUNTER — Emergency Department (HOSPITAL_BASED_OUTPATIENT_CLINIC_OR_DEPARTMENT_OTHER): Payer: Medicare Other

## 2015-03-14 ENCOUNTER — Encounter (HOSPITAL_BASED_OUTPATIENT_CLINIC_OR_DEPARTMENT_OTHER): Payer: Self-pay | Admitting: *Deleted

## 2015-03-14 ENCOUNTER — Emergency Department (HOSPITAL_BASED_OUTPATIENT_CLINIC_OR_DEPARTMENT_OTHER)
Admission: EM | Admit: 2015-03-14 | Discharge: 2015-03-14 | Disposition: A | Discharge status: Home / Self Care | Payer: Medicare Other | Attending: Emergency Medicine | Admitting: Emergency Medicine

## 2015-03-14 DIAGNOSIS — M5410 Radiculopathy, site unspecified: Secondary | ICD-10-CM

## 2015-03-14 DIAGNOSIS — Z8719 Personal history of other diseases of the digestive system: Secondary | ICD-10-CM | POA: Diagnosis not present

## 2015-03-14 DIAGNOSIS — Z86718 Personal history of other venous thrombosis and embolism: Secondary | ICD-10-CM | POA: Insufficient documentation

## 2015-03-14 DIAGNOSIS — Z79899 Other long term (current) drug therapy: Secondary | ICD-10-CM | POA: Insufficient documentation

## 2015-03-14 DIAGNOSIS — Z8709 Personal history of other diseases of the respiratory system: Secondary | ICD-10-CM | POA: Diagnosis not present

## 2015-03-14 DIAGNOSIS — Z8572 Personal history of non-Hodgkin lymphomas: Secondary | ICD-10-CM | POA: Diagnosis not present

## 2015-03-14 DIAGNOSIS — R200 Anesthesia of skin: Secondary | ICD-10-CM | POA: Diagnosis present

## 2015-03-14 DIAGNOSIS — G8929 Other chronic pain: Secondary | ICD-10-CM | POA: Insufficient documentation

## 2015-03-14 DIAGNOSIS — Z8659 Personal history of other mental and behavioral disorders: Secondary | ICD-10-CM | POA: Diagnosis not present

## 2015-03-14 DIAGNOSIS — M79605 Pain in left leg: Secondary | ICD-10-CM

## 2015-03-14 LAB — CBC WITH DIFFERENTIAL/PLATELET
BASOS ABS: 0 10*3/uL (ref 0.0–0.1)
Basophils Relative: 1 % (ref 0–1)
EOS ABS: 0.1 10*3/uL (ref 0.0–0.7)
EOS PCT: 2 % (ref 0–5)
HCT: 45.7 % (ref 39.0–52.0)
Hemoglobin: 15.7 g/dL (ref 13.0–17.0)
LYMPHS PCT: 34 % (ref 12–46)
Lymphs Abs: 2.2 10*3/uL (ref 0.7–4.0)
MCH: 30.3 pg (ref 26.0–34.0)
MCHC: 34.4 g/dL (ref 30.0–36.0)
MCV: 88.2 fL (ref 78.0–100.0)
MONO ABS: 0.6 10*3/uL (ref 0.1–1.0)
Monocytes Relative: 9 % (ref 3–12)
NEUTROS ABS: 3.5 10*3/uL (ref 1.7–7.7)
Neutrophils Relative %: 54 % (ref 43–77)
PLATELETS: 236 10*3/uL (ref 150–400)
RBC: 5.18 MIL/uL (ref 4.22–5.81)
RDW: 13.7 % (ref 11.5–15.5)
WBC: 6.4 10*3/uL (ref 4.0–10.5)

## 2015-03-14 LAB — BASIC METABOLIC PANEL
ANION GAP: 9 (ref 5–15)
BUN: 18 mg/dL (ref 6–20)
CHLORIDE: 105 mmol/L (ref 101–111)
CO2: 24 mmol/L (ref 22–32)
Calcium: 8.9 mg/dL (ref 8.9–10.3)
Creatinine, Ser: 1.01 mg/dL (ref 0.61–1.24)
GFR calc Af Amer: >60 mL/min (ref >60)
GFR calc non Af Amer: >60 mL/min (ref >60)
Glucose, Bld: 109 mg/dL — ABNORMAL HIGH (ref 65–99)
POTASSIUM: 3.9 mmol/L (ref 3.5–5.1)
Sodium: 138 mmol/L (ref 135–145)

## 2015-03-14 MED ORDER — ORPHENADRINE CITRATE ER 100 MG PO TB12: 100.0000 mg | ORAL_TABLET | Freq: Two times a day (BID) | ORAL | Status: DC

## 2015-03-14 NOTE — ED Notes (Signed)
Patient transported back from Ultrasound 

## 2015-03-14 NOTE — ED Provider Notes (Signed)
CSN: [id number]     Arrival date & time 03/14/15  0017 History   First MD Initiated Contact with Patient 03/14/15 [phone]     Chief Complaint  Patient presents with  . Numbness    left calf     (Consider location/radiation/quality/duration/timing/severity/associated sxs/prior Treatment) Patient is a 54 y.o. male presenting with leg pain.  Leg Pain Location:  Leg Time since incident:  1 day Injury: no   Leg location:  L lower leg Pain details:    Quality:  Aching and tingling   Radiates to:  Does not radiate   Severity:  Moderate   Onset quality:  Gradual   Duration:  1 day   Timing:  Constant   Progression:  Waxing and waning Chronicity:  New Prior injury to area:  No Relieved by:  Nothing Worsened by:  Bearing weight Associated symptoms: no back pain, no itching, no muscle weakness and no neck pain   Associated symptoms comment:  Ha last night, intermittent chest pain over months   Past Medical History  Diagnosis Date  . Right leg DVT august 2012  . Chronic back pain   . History of depression   . Diverticulosis 2006  . History of pneumothorax   . Lipoma   . Rectal pain   . Depression    Past Surgical History  Procedure Laterality Date  . Spinal steroid injections    . Chest tube insertion      for pneumothorax  . Inguinal hernia repair  2002, 2003    x2  . Cystectomy  2010, 2011  . Cholecystectomy  2005  . Lipoma excision  10/28/2011    Procedure: MINOR EXCISION LIPOMA;  Surgeon: Rolm Bookbinder, MD;  Location: [facility];  Service: General;  Laterality: N/A;  2 back lipoma excison  1 x 2 cm subcutaneous, 3 x4 cm subcutaneous  . Small intestine surgery     Family History  Problem Relation Age of Onset  . Stroke Father   . Heart attack Mother   . Heart disease Mother     heart attack  . Colon cancer Neg Hx   . Stomach cancer Neg Hx    History  Substance Use Topics  . Smoking status: Never Smoker   . Smokeless tobacco: Never Used  .  Alcohol Use: No    Review of Systems  Musculoskeletal: Negative for back pain and neck pain.  Skin: Negative for itching.  All other systems reviewed and are negative.     Allergies  Review of patient's allergies indicates no known allergies.  Home Medications   Prior to Admission medications   Medication Sig Start Date End Date Taking? Authorizing Provider  orphenadrine (NORFLEX) 100 MG tablet Take 1 tablet (100 mg total) by mouth 2 (two) times daily. 03/14/15   Debby Freiberg, MD  phenazopyridine (PYRIDIUM) 200 MG tablet Take 200 mg by mouth 3 (three) times daily as needed for pain.    Historical Provider, MD  tamsulosin (FLOMAX) 0.4 MG CAPS capsule Take 0.4 mg by mouth daily.    Historical Provider, MD   BP 108/71 mmHg  Pulse 63  Temp(Src) 97.7 F (36.5 C) (Oral)  Resp 16  Ht 5\' 9"  (1.753 m)  Wt 208 lb (94.348 kg)  BMI 30.70 kg/m2  SpO2 98% Physical Exam  Constitutional: He is oriented to person, place, and time. He appears well-developed and well-nourished.  HENT:  Head: Normocephalic and atraumatic.  Eyes: Conjunctivae and EOM are normal.  Neck:  Normal range of motion. Neck supple.  Cardiovascular: Normal rate, regular rhythm and normal heart sounds.   Pulmonary/Chest: Effort normal and breath sounds normal. No respiratory distress.  Abdominal: He exhibits no distension. There is no tenderness. There is no rebound and no guarding.  Musculoskeletal: Normal range of motion.  2+ pulses bil le, no swelling, tenderness of L calf, neuro intact  Neurological: He is alert and oriented to person, place, and time. He has normal strength and normal reflexes. No cranial nerve deficit or sensory deficit. Gait normal. GCS eye subscore is 4. GCS verbal subscore is 5. GCS motor subscore is 6.  Skin: Skin is warm and dry.  Vitals reviewed.   ED Course  Procedures (including critical care time) Labs Review Labs Reviewed  BASIC METABOLIC PANEL - Abnormal; Notable for the  following:    Glucose, Bld 109 (*)    All other components within normal limits  CBC WITH DIFFERENTIAL/PLATELET    Imaging Review Dg Chest 2 View  03/14/2015   CLINICAL DATA:  Pain and numbness in the left calf.  EXAM: CHEST  2 VIEW  COMPARISON:  05/13/2014  FINDINGS: The heart size and mediastinal contours are within normal limits. Lungs are clear. Slight bilateral apical pleural thickening, stable. The visualized skeletal structures are unremarkable. Prominent left pericardial fat pad, unchanged.  IMPRESSION: No acute abnormality.   Electronically Signed   By: Lorriane Shire M.D.   On: 03/14/2015 08:48   US Venous Img Lower Unilateral Left  03/14/2015   CLINICAL DATA:  Left calf pain starting today.  EXAM: LEFT LOWER EXTREMITY VENOUS DOPPLER ULTRASOUND  TECHNIQUE: Gray-scale sonography with graded compression, as well as color Doppler and duplex ultrasound, were performed to evaluate the deep venous system from the level of the common femoral vein through the popliteal and proximal calf veins. Spectral Doppler was utilized to evaluate flow at rest and with distal augmentation maneuvers.  COMPARISON:  None.  FINDINGS: Right common femoral vein is compressible and patent.  Normal compressibility, augmentation and color Doppler flow in the left common femoral vein, left femoral vein and left popliteal vein. The left saphenofemoral junction is patent. Visualized deep left calf veins are patent. Left great saphenous vein is compressible.  IMPRESSION: Negative for deep vein thrombosis in the left lower extremity.   Electronically Signed   By: Markus Daft M.D.   On: 03/14/2015 09:29     EKG Interpretation   Date/Time:  Friday Mar 14 2015 08:19:07 EDT Ventricular Rate:  67 PR Interval:  198 QRS Duration: 80 QT Interval:  398 QTC Calculation: 420 R Axis:   66 Text Interpretation:  Normal sinus rhythm Normal ECG No significant change  since last tracing Confirmed by Debby Freiberg [phone]) on  03/14/2015  8:44:56 AM      MDM   Final diagnoses:  Left leg pain  Radicular leg pain    54 y.o. male with pertinent PMH of prior DVT presents with similar leg pain on opposite side.  Exam with isolated tenderness of calf.  No swelling.  No NV deficit.  Pt also endorses ha on ROS, however states that he chronically gets ha, and does not feel this is that out of the ordinary.  He also has chronic back pain without acute change of signs of cauda equina.  He has seen a spinal surgeon in the past for same.  He does endorse intermittent chest pain, but no dyspnea, and symptoms are mild and not worse lately.  Wu as above unremarkable.  Likely radicular pain.  DC home to fu with pcp, spinal surgeon.  I have reviewed all laboratory and imaging studies if ordered as above  1. Radicular leg pain   2. Left leg pain        Debby Freiberg, MD 03/14/15 1004

## 2015-03-14 NOTE — ED Notes (Signed)
Patient transported to Ultrasound 

## 2015-03-14 NOTE — Discharge Instructions (Signed)

## 2015-03-14 NOTE — ED Notes (Signed)
Patient states he had a headache ache and pressure in the occipital area of his right head last night.  Took tylenol with relief.  States he did not sleep good.  States this morning at 0500 he got up and has numbness in the left calf.  History of DVT in the lower extremities.

## 2015-05-30 ENCOUNTER — Emergency Department (HOSPITAL_BASED_OUTPATIENT_CLINIC_OR_DEPARTMENT_OTHER)
Admission: EM | Admit: 2015-05-30 | Discharge: 2015-05-31 | Disposition: A | Discharge status: Home / Self Care | Payer: Medicare Other | Attending: Emergency Medicine | Admitting: Emergency Medicine

## 2015-05-30 DIAGNOSIS — Z79899 Other long term (current) drug therapy: Secondary | ICD-10-CM | POA: Diagnosis not present

## 2015-05-30 DIAGNOSIS — G8929 Other chronic pain: Secondary | ICD-10-CM | POA: Diagnosis not present

## 2015-05-30 DIAGNOSIS — Z86018 Personal history of other benign neoplasm: Secondary | ICD-10-CM | POA: Insufficient documentation

## 2015-05-30 DIAGNOSIS — L259 Unspecified contact dermatitis, unspecified cause: Secondary | ICD-10-CM | POA: Diagnosis not present

## 2015-05-30 DIAGNOSIS — Z8719 Personal history of other diseases of the digestive system: Secondary | ICD-10-CM | POA: Insufficient documentation

## 2015-05-30 DIAGNOSIS — Z86718 Personal history of other venous thrombosis and embolism: Secondary | ICD-10-CM | POA: Insufficient documentation

## 2015-05-30 DIAGNOSIS — Z8659 Personal history of other mental and behavioral disorders: Secondary | ICD-10-CM | POA: Diagnosis not present

## 2015-05-30 DIAGNOSIS — R21 Rash and other nonspecific skin eruption: Secondary | ICD-10-CM | POA: Diagnosis present

## 2015-05-30 MED ORDER — DEXAMETHASONE SODIUM PHOSPHATE 10 MG/ML IJ SOLN
10.0000 mg | Freq: Once | INTRAMUSCULAR | Status: AC
  Administered 2015-05-30: 10 mg via INTRAMUSCULAR
  Filled 2015-05-30: qty 1

## 2015-05-30 MED ORDER — HYDROXYZINE HCL 25 MG PO TABS: 25.0000 mg | ORAL_TABLET | Freq: Four times a day (QID) | ORAL | Status: DC

## 2015-05-30 MED ORDER — PREDNISONE 20 MG PO TABS: ORAL_TABLET | ORAL | Status: DC

## 2015-05-30 NOTE — ED Notes (Signed)
MD at bedside. 

## 2015-05-30 NOTE — Discharge Instructions (Signed)
Prednisone as prescribed.  Hydroxyzine as prescribed as needed for itching.   Contact Dermatitis Contact dermatitis is a reaction to certain substances that touch the skin. Contact dermatitis can be either irritant contact dermatitis or allergic contact dermatitis. Irritant contact dermatitis does not require previous exposure to the substance for a reaction to occur.Allergic contact dermatitis only occurs if you have been exposed to the substance before. Upon a repeat exposure, your body reacts to the substance.  CAUSES  Many substances can cause contact dermatitis. Irritant dermatitis is most commonly caused by repeated exposure to mildly irritating substances, such as:  Makeup.  Soaps.  Detergents.  Bleaches.  Acids.  Metal salts, such as nickel. Allergic contact dermatitis is most commonly caused by exposure to:  Poisonous plants.  Chemicals (deodorants, shampoos).  Jewelry.  Latex.  Neomycin in triple antibiotic cream.  Preservatives in products, including clothing. SYMPTOMS  The area of skin that is exposed may develop:  Dryness or flaking.  Redness.  Cracks.  Itching.  Pain or a burning sensation.  Blisters. With allergic contact dermatitis, there may also be swelling in areas such as the eyelids, mouth, or genitals.  DIAGNOSIS  Your caregiver can usually tell what the problem is by doing a physical exam. In cases where the cause is uncertain and an allergic contact dermatitis is suspected, a patch skin test may be performed to help determine the cause of your dermatitis. TREATMENT Treatment includes protecting the skin from further contact with the irritating substance by avoiding that substance if possible. Barrier creams, powders, and gloves may be helpful. Your caregiver may also recommend:  Steroid creams or ointments applied 2 times daily. For best results, soak the rash area in cool water for 20 minutes. Then apply the medicine. Cover the area with  a plastic wrap. You can store the steroid cream in the refrigerator for a "chilly" effect on your rash. That may decrease itching. Oral steroid medicines may be needed in more severe cases.  Antibiotics or antibacterial ointments if a skin infection is present.  Antihistamine lotion or an antihistamine taken by mouth to ease itching.  Lubricants to keep moisture in your skin.  Burow's solution to reduce redness and soreness or to dry a weeping rash. Mix one packet or tablet of solution in 2 cups cool water. Dip a clean washcloth in the mixture, wring it out a bit, and put it on the affected area. Leave the cloth in place for 30 minutes. Do this as often as possible throughout the day.  Taking several cornstarch or baking soda baths daily if the area is too large to cover with a washcloth. Harsh chemicals, such as alkalis or acids, can cause skin damage that is like a burn. You should flush your skin for 15 to 20 minutes with cold water after such an exposure. You should also seek immediate medical care after exposure. Bandages (dressings), antibiotics, and pain medicine may be needed for severely irritated skin.  HOME CARE INSTRUCTIONS  Avoid the substance that caused your reaction.  Keep the area of skin that is affected away from hot water, soap, sunlight, chemicals, acidic substances, or anything else that would irritate your skin.  Do not scratch the rash. Scratching may cause the rash to become infected.  You may take cool baths to help stop the itching.  Only take over-the-counter or prescription medicines as directed by your caregiver.  See your caregiver for follow-up care as directed to make sure your skin is healing properly.  SEEK MEDICAL CARE IF:   Your condition is not better after 3 days of treatment.  You seem to be getting worse.  You see signs of infection such as swelling, tenderness, redness, soreness, or warmth in the affected area.  You have any problems related  to your medicines. Document Released: 10/08/2000 Document Revised: 01/03/2012 Document Reviewed: 03/16/2011 [facility] Patient Information 2015 [place], [place]. This information is not intended to replace advice given to you by your health care provider. Make sure you discuss any questions you have with your health care provider.

## 2015-05-30 NOTE — ED Notes (Signed)
Pt reports rash from poison Ivy  Second infection in 30 days has ran out of medication

## 2015-05-31 NOTE — ED Provider Notes (Signed)
CSN: [id number]     Arrival date & time 05/30/15  2053 History   First MD Initiated Contact with Patient 05/30/15 2333     Chief Complaint  Patient presents with  . Poison Ivy     (Consider location/radiation/quality/duration/timing/severity/associated sxs/prior Treatment) HPI Comments: Patient is a 54 year old male who presents with complaints of itchy rash to both arms, torso, and legs. He was recently treated with prednisone for presumed poison ivy.  Patient is a 54 y.o. male presenting with poison ivy. The history is provided by the patient.  Poison Zachary [name] This is a recurrent problem. The current episode started yesterday. The problem occurs constantly. The problem has been rapidly worsening. Nothing aggravates the symptoms. Nothing relieves the symptoms. He has tried nothing for the symptoms. The treatment provided no relief.    Past Medical History  Diagnosis Date  . Right leg DVT august 2012  . Chronic back pain   . History of depression   . Diverticulosis 2006  . History of pneumothorax   . Lipoma   . Rectal pain   . Depression    Past Surgical History  Procedure Laterality Date  . Spinal steroid injections    . Chest tube insertion      for pneumothorax  . Inguinal hernia repair  2002, 2003    x2  . Cystectomy  2010, 2011  . Cholecystectomy  2005  . Lipoma excision  10/28/2011    Procedure: MINOR EXCISION LIPOMA;  Surgeon: Rolm Bookbinder, MD;  Location: [facility];  Service: General;  Laterality: N/A;  2 back lipoma excison  1 x 2 cm subcutaneous, 3 x4 cm subcutaneous  . Small intestine surgery     Family History  Problem Relation Age of Onset  . Stroke Father   . Heart attack Mother   . Heart disease Mother     heart attack  . Colon cancer Neg Hx   . Stomach cancer Neg Hx    History  Substance Use Topics  . Smoking status: Never Smoker   . Smokeless tobacco: Never Used  . Alcohol Use: No    Review of Systems  All other systems reviewed  and are negative.     Allergies  Review of patient's allergies indicates no known allergies.  Home Medications   Prior to Admission medications   Medication Sig Start Date End Date Taking? Authorizing Provider  hydrOXYzine (ATARAX/VISTARIL) 25 MG tablet Take 1 tablet (25 mg total) by mouth every 6 (six) hours. 05/30/15   Veryl Speak, MD  orphenadrine (NORFLEX) 100 MG tablet Take 1 tablet (100 mg total) by mouth 2 (two) times daily. 03/14/15   Debby Freiberg, MD  phenazopyridine (PYRIDIUM) 200 MG tablet Take 200 mg by mouth 3 (three) times daily as needed for pain.    Historical Provider, MD  predniSONE (DELTASONE) 20 MG tablet Take 60 mg daily for 3 days, then 40 mg daily for 3 days, then 20 mg daily for 3 days. 05/30/15   Veryl Speak, MD  tamsulosin (FLOMAX) 0.4 MG CAPS capsule Take 0.4 mg by mouth daily.    Historical Provider, MD   BP 121/73 mmHg  Pulse 63  Temp(Src) 97.8 F (36.6 C) (Oral)  Resp 18  SpO2 100% Physical Exam  Constitutional: He is oriented to person, place, and time. He appears well-developed and well-nourished. No distress.  HENT:  Head: Normocephalic and atraumatic.  Neck: Normal range of motion. Neck supple.  Neurological: He is alert and oriented to person,  place, and time.  Skin: Skin is warm and dry. He is not diaphoretic.  There is a macular rash present to both forearms, thighs, shoulders, and chest.  Nursing note and vitals reviewed.   ED Course  Procedures (including critical care time) Labs Review Labs Reviewed - No data to display  Imaging Review No results found.   EKG Interpretation None      MDM   Final diagnoses:  Contact dermatitis    This appears to be a contact dermatitis. This will be treated with IM Decadron, by mouth prednisone, hydroxyzine, and when necessary return.    Veryl Speak, MD 05/31/15 [phone]

## 2015-09-02 ENCOUNTER — Encounter: Payer: Self-pay | Admitting: Family Medicine

## 2015-09-02 ENCOUNTER — Ambulatory Visit (INDEPENDENT_AMBULATORY_CARE_PROVIDER_SITE_OTHER): Payer: Medicare Other | Admitting: Family Medicine

## 2015-09-02 VITALS — BP 108/70 | HR 79 | Temp 97.9°F | Ht 73.0 in | Wt 204.2 lb

## 2015-09-02 DIAGNOSIS — R1030 Lower abdominal pain, unspecified: Secondary | ICD-10-CM | POA: Diagnosis present

## 2015-09-02 DIAGNOSIS — R1031 Right lower quadrant pain: Secondary | ICD-10-CM

## 2015-09-02 DIAGNOSIS — Z23 Encounter for immunization: Secondary | ICD-10-CM

## 2015-09-02 NOTE — Progress Notes (Signed)
Patient ID: Zachary [name], male   DOB: 1961/05/29, 54 y.o.   MRN: [id number]   Subjective: CC:  Right lower quadrant pain HPI: This history was provided by the patient.  Patient's PMH significant for BPH, prostatitis, dyslipidemia, multiple lipomas, chronic back pain and right inguinal hernia repair.  Patient is a 54 y.o. male presenting to clinic today for right inguinal region pain. Patient had right inguinal hernia repair in [place] in 2003. Patient states he had mesh placed at that time and had no issues with the surgery until approximately 3 weeks ago.  At that time, patient began having a sharp pain and pressure in lower right inguinal area after heavy lifting.  Patient states pain persists and is worse when he lifts heavy objects, squats down or sits for an extended period of time. He has been more active lately with home improvement projects.  Patient states pain does not radiate.  Patient has not taken anything to treat pain at home.  Patient denies urinary symptoms such as hesitancy, weak stream, hematuria, dysuria and urinary frequency or urgency.  Patient denies abdominal pain, N/V/D, fever or constipation. Patient denies melena or hematochezia.    Concerns today include:  1. Right groin pain  Social History Reviewed: Never smoker. FamHx and MedHx updated.  Please see EMR. Health Maintenance: Influenza vaccination given in clinic  ROS: Per HPI   Objective: Office vital signs reviewed. BP 108/70 mmHg  Pulse 79  Temp(Src) 97.9 F (36.6 C) (Oral)  Ht 6\' 1"  (1.854 m)  Wt 204 lb 4 oz (92.647 kg)  BMI 26.95 kg/m2  Physical Examination:  General: Pleasant, cooperative white male who appears stated age.  Awake, alert, well nourished, NAD Cardio: RRR, S1S2 heard, no murmurs appreciated, no edema, cyanosis or clubbing; +2 radial pulses bilaterally Pulm: CTAB, no wheezes, rhonchi or rales GI: soft, NT/ND,+BS x4, no hepatomegaly, no splenomegaly. Well healed midline abdominal  incision scar from previous hernia repair surgery. GU: Well healed surgical scar to right groin from previous hernia repair.  No erythema or edema noted. Area is not warm to touch.  No pain with palpation.  No deformity or protrusions noted with bilateral inguinal hernia exam.  Assessment/ Plan: 54 y.o. male who presents with right inguinal region pain x 3 weeks.  Pain started after patient lifted something heavy.  Patient has inguinal hernia repair in 2003 with mesh placement.  No inguinal hernia noted on bilateral exam.  Patient has no systemic signs of infection with no fever, N/V/D and no urinary signs or symptoms.  Patient has not treated pain with any medication. Suggested he try 500 - 650 mg of OTC Tylenol every 8 hours or 600 - 800 mg of OTC ibuprofen every 8 hours to manage pain for the next week.  If that alleviates pain, patient may not need additional follow-up.  If the pain persists despite use of medications, patient may need surgery referral.  He has seen Dr. Donne Hazel at [facility] in the past and would like a referral to see him.  We agreed that he would try managing the pain with medication for 1-2 weeks and if it persists, he will call the clinic and request the referral.   Follow-up Patient to follow-up with clinic if pain persists despite use of medication for next 2 weeks or if pain continues after 2 weeks.  Patient alerted to contact clinic earlier if he develops fever, N/V, severe abdominal pain, or urinary symptoms.  Sherron Ales, NP Student [facility]  Resident attestation: I have seen and examined the patient. I have read and edited the above note to reflect my findings. The above assessment and plan was formulated by me and discussed with Ms. Owens Shark.  No evidence of recurrent hernia on exam today. Discussed OTC analgesics, attempting to limit motions/lifting that seems to be exacerbating the symptoms. If still having issues will refer to general surgery, though I told  him I don't think he would need surgery.  Tawanna Sat, MD 09/03/2015, 11:32 PM PGY-3, [place]

## 2015-09-02 NOTE — Patient Instructions (Signed)
Try taking the over the counter pain medicines:   Tylenol 500 or 650mg  every 6 hours  Ibuprofen up to 600mg  every 6 hours   If you are not getting any better, you can always call our clinic and I will send in a referral to Dr. Donne Hazel (general surgeon) to take a look at it as well.

## 2016-02-24 ENCOUNTER — Encounter: Payer: Self-pay | Admitting: Family Medicine

## 2016-02-24 ENCOUNTER — Ambulatory Visit (INDEPENDENT_AMBULATORY_CARE_PROVIDER_SITE_OTHER): Payer: Medicare Other | Admitting: Family Medicine

## 2016-02-24 VITALS — BP 110/74 | HR 72 | Temp 97.8°F | Ht 73.0 in | Wt 201.4 lb

## 2016-02-24 DIAGNOSIS — L03115 Cellulitis of right lower limb: Secondary | ICD-10-CM | POA: Diagnosis present

## 2016-02-24 MED ORDER — SULFAMETHOXAZOLE-TRIMETHOPRIM 800-160 MG PO TABS: 2.0000 | ORAL_TABLET | Freq: Two times a day (BID) | ORAL | Status: DC

## 2016-02-24 NOTE — Patient Instructions (Signed)
Stop doxycycline Start bactrim 2 tablets twice daily for 10 days Apply warm compresses Follow up in 2 days for recheck of leg  Be well, Dr. Ardelia Mems   Cellulitis Cellulitis is an infection of the skin and the tissue beneath it. The infected area is usually red and tender. Cellulitis occurs most often in the arms and lower legs.  CAUSES  Cellulitis is caused by bacteria that enter the skin through cracks or cuts in the skin. The most common types of bacteria that cause cellulitis are staphylococci and streptococci. SIGNS AND SYMPTOMS   Redness and warmth.  Swelling.  Tenderness or pain.  Fever. DIAGNOSIS  Your health care provider can usually determine what is wrong based on a physical exam. Blood tests may also be done. TREATMENT  Treatment usually involves taking an antibiotic medicine. HOME CARE INSTRUCTIONS   Take your antibiotic medicine as directed by your health care provider. Finish the antibiotic even if you start to feel better.  Keep the infected arm or leg elevated to reduce swelling.  Apply a warm cloth to the affected area up to 4 times per day to relieve pain.  Take medicines only as directed by your health care provider.  Keep all follow-up visits as directed by your health care provider. SEEK MEDICAL CARE IF:   You notice red streaks coming from the infected area.  Your red area gets larger or turns dark in color.  Your bone or joint underneath the infected area becomes painful after the skin has healed.  Your infection returns in the same area or another area.  You notice a swollen bump in the infected area.  You develop new symptoms.  You have a fever. SEEK IMMEDIATE MEDICAL CARE IF:   You feel very sleepy.  You develop vomiting or diarrhea.  You have a general ill feeling (malaise) with muscle aches and pains.   This information is not intended to replace advice given to you by your health care provider. Make sure you discuss any questions  you have with your health care provider.   Document Released: 07/21/2005 Document Revised: 07/02/2015 Document Reviewed: 12/27/2011 Elsevier Interactive Patient Education [employer].

## 2016-02-24 NOTE — Assessment & Plan Note (Signed)
Likely triggered by insect bite. The leg does not look significantly worse to me today based on description from prior encounters. There may be an abscess forming beneath leg, but no appreciable fluctuance today. Patient is systemically well. Plan: - stop doxycycline - switch to bactrim DS 2 tabs twice daily for 10 days - warm compresses - follow up in 2 days for recheck. If fluctuant at that time, consider I&D - discussed return precautions & reasons to follow up sooner with patient.
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
## 2016-02-26 ENCOUNTER — Encounter: Payer: Self-pay | Admitting: Family Medicine

## 2016-02-26 ENCOUNTER — Ambulatory Visit (INDEPENDENT_AMBULATORY_CARE_PROVIDER_SITE_OTHER): Payer: Medicare Other | Admitting: Family Medicine

## 2016-02-26 VITALS — BP 104/70 | HR 75 | Temp 97.8°F | Wt 202.0 lb

## 2016-02-26 DIAGNOSIS — L03115 Cellulitis of right lower limb: Secondary | ICD-10-CM

## 2016-02-26 NOTE — Patient Instructions (Addendum)
Keep the area covered Remove the packing on Saturday Continue antibiotics I gave you the other day.  If any worsening this weekend please go to ER (especially if fevers, worsening redness, nausea/vomiting, etc).  Be well, Dr. Ardelia Mems

## 2016-02-26 NOTE — Progress Notes (Signed)
Date of Visit: 02/26/2016   HPI:  Patient presents for follow up of leg cellulitis.  Started on bactrim 2 days ago. Since then has done well. Leg has started draining. He shaved the leg hair around it. No fevers. Draining serosanguinous fluid. Eating and drinking well. No vomiting. Tolerating bactrim well. He would like me to attempt I&D today.  ROS: See HPI.  [place]: history of hyperlipidemia, chronic back pain, chronic prostatitis, BPH  PHYSICAL EXAM: BP 104/70 mmHg  Pulse 75  Temp(Src) 97.8 F (36.6 C) (Oral)  Wt 202 lb (91.627 kg) Gen: NAD, pleasant, cooperative Extremities: RLE with area of erythema and induration, draining slight serosanguinous fluid. erythema has receded from previously drawn line. No discernable fluctuance but quite tensely indurated     PROCEDURE NOTE:  After informed consent was obtained and consent documented in the chart, the skin of the RLE was cleaned with alcohol. Local anesthesia was obtained with appx 2 cc of 1% lidocaine with epinephrine. Skin prepped with betadine. An 11 blade was used to make a 0.5cm cm incision into the skin with fairly immediate expression of purulent drainage. The abscess was then externally manipulated to aid in further expression of pus and probed bluntly with a hemostat. The abscess cavity was packed with iodoform gauze. Patient tolerated the procedure well, with no immediate complications. The incision was covered with a bandage.   ASSESSMENT/PLAN:  Cellulitis of right leg Overall erythema has improved since switching to bactrim. More formed abscess on exam today. Discussed option for attempted I&D with patient, he wanted to proceed. See procedure note above. Some drainage expressed. Packed. As today is Thursday, will have him remove packing at home on Saturday. He'll follow up here Monday for wound check. Continue bactrim. Extensive discussion of return precautions should things worsen this weekend. Patient agreeable to this  plan.   FOLLOW UP: Follow up in 4 days for cellulitis/abscess  [place] J. Ardelia Mems, [place]

## 2016-02-26 NOTE — Assessment & Plan Note (Signed)
Overall erythema has improved since switching to bactrim. More formed abscess on exam today. Discussed option for attempted I&D with patient, he wanted to proceed. See procedure note above. Some drainage expressed. Packed. As today is Thursday, will have him remove packing at home on Saturday. He'll follow up here Monday for wound check. Continue bactrim. Extensive discussion of return precautions should things worsen this weekend. Patient agreeable to this plan.

## 2016-03-01 ENCOUNTER — Encounter: Payer: Self-pay | Admitting: Family Medicine

## 2016-03-01 ENCOUNTER — Ambulatory Visit (INDEPENDENT_AMBULATORY_CARE_PROVIDER_SITE_OTHER): Payer: Medicare Other | Admitting: Family Medicine

## 2016-03-01 VITALS — BP 106/74 | HR 94 | Temp 97.5°F | Ht 73.0 in | Wt 199.3 lb

## 2016-03-01 DIAGNOSIS — L03115 Cellulitis of right lower limb: Secondary | ICD-10-CM

## 2016-03-01 NOTE — Patient Instructions (Signed)
Thank you so much for coming to visit today! Your cellulitis is doing much better. Please keep taking Bactrim as prescribed. Follow up with Dr. Lamar Benes on Monday as scheduled.  Thanks again! Dr. Gerlean Ren

## 2016-03-01 NOTE — Assessment & Plan Note (Signed)
-   Continue Bactrim - Follow up Monday 5/15 with Dr. Lamar Benes - Repeat I&D not indicated at this time - To follow up sooner if drainage, worsening erythema or warmth, or fevers occur.

## 2016-03-01 NOTE — Progress Notes (Signed)
Subjective:     Patient ID: Zachary [name], male   DOB: 10-21-1961, 55 y.o.   MRN: [id number]  HPI Zachary [name] is a 55yo male presenting for follow up of cellulitis. - Believes lesion is from a bug bite, although he is unsure what bit him - Reports improvement since last office visit - Still taking Bactrim - Denies fevers, worsening redness, drainage - Tender, sometimes tenderness makes it difficult to walk - Reports history of I&D 5/4 with minimal drainage  - Nonsmoker  Review of Systems Per HPI. Other systems negative.    Objective:   Physical Exam  Constitutional: He appears well-developed and well-nourished.  Skin:  On Right Leg: 6cm x4cm area of erythema with central lesion from previous I&D and surrounding peeling. Indurated. No fluctuance palpated. Please refer to picture.       Assessment and Plan:     Cellulitis of right leg - Continue Bactrim - Follow up Monday 5/15 with Dr. Lamar Benes - Repeat I&D not indicated at this time - To follow up sooner if drainage, worsening erythema or warmth, or fevers occur.

## 2016-03-08 ENCOUNTER — Ambulatory Visit (INDEPENDENT_AMBULATORY_CARE_PROVIDER_SITE_OTHER): Payer: Medicare Other | Admitting: Family Medicine

## 2016-03-08 VITALS — BP 121/79 | HR 74 | Temp 97.8°F | Wt 199.0 lb

## 2016-03-08 DIAGNOSIS — L03115 Cellulitis of right lower limb: Secondary | ICD-10-CM

## 2016-03-08 DIAGNOSIS — R21 Rash and other nonspecific skin eruption: Secondary | ICD-10-CM | POA: Diagnosis not present

## 2016-03-08 MED ORDER — TRIAMCINOLONE ACETONIDE 0.5 % EX OINT: 1.0000 "application " | TOPICAL_OINTMENT | Freq: Two times a day (BID) | CUTANEOUS | Status: DC

## 2016-03-08 NOTE — Patient Instructions (Signed)
Monitor the leg for fluid under the skin, redness. If either develops come back to the clinic. Continue warm compresses  For the arm rash, start the triamcinolone 0.5% cream/ointment twice a day for 1 week, then once daily until it goes away.

## 2016-03-08 NOTE — Progress Notes (Signed)
   Subjective:    Patient ID: Zachary [name], male    DOB: Mar 21, 1961, 55 y.o.   MRN: [id number]  HPI  CC: follow up right leg cellulitis  # Right leg cellulitis:  Finished bactrim  Redness is completely gone, very minimal tenderness. Notices something there when   Has not been doing warm compresses for past 2 days  No drainage ROS: no fevers, no numbness of LE  # Left arm rash  Noticed this morning  Mildly itchy, not painful  Says he was outside and around vegetation over the weekend. Has another rash lower leg that he says he knows is from walking in vegetation  No burning, tingling, sharp pain  Social Hx: never smoker  Review of Systems   See HPI for ROS.   Past medical history, surgical, family, and social history reviewed and updated in the EMR as appropriate. Objective:  BP 121/79 mmHg  Pulse 74  Temp(Src) 97.8 F (36.6 C) (Oral)  Wt 199 lb (90.266 kg) Vitals and nursing note reviewed  General: no apparent distress  Skin:   Right lateral upper calf there is an approx 1.5 x 1.5cm area of induration without fluctuance and no erythema (there is surrounding area of slightly desquamated skin). Non-tender.  Left forearm: approx 6 26mm white papules on red base, not quite linear distribution over forearm, not draining, not tender  Assessment & Plan:  Cellulitis of right leg Almost resolved with exception of some persistent induration. Does not feel like this can be drained. Recommended continuing warm compresses 1-2 times daily, monitor for fluid or drainage and return if this develops or redness comes back.  Rash Left forearm seems most consistent with contact dermatitis. Does not have typical time course for shingles but this could be a possibility. Recommended triam 0.5% ointment.

## 2016-03-08 NOTE — Assessment & Plan Note (Signed)
Almost resolved with exception of some persistent induration. Does not feel like this can be drained. Recommended continuing warm compresses 1-2 times daily, monitor for fluid or drainage and return if this develops or redness comes back.

## 2016-03-16 ENCOUNTER — Ambulatory Visit (INDEPENDENT_AMBULATORY_CARE_PROVIDER_SITE_OTHER): Payer: Medicare Other | Admitting: Family Medicine

## 2016-03-16 ENCOUNTER — Encounter: Payer: Self-pay | Admitting: Family Medicine

## 2016-03-16 VITALS — BP 112/81 | HR 77 | Temp 99.0°F | Ht 73.0 in | Wt 199.0 lb

## 2016-03-16 DIAGNOSIS — R21 Rash and other nonspecific skin eruption: Secondary | ICD-10-CM

## 2016-03-16 MED ORDER — FLUOCINONIDE 0.05 % EX OINT: 1.0000 "application " | TOPICAL_OINTMENT | Freq: Two times a day (BID) | CUTANEOUS | Status: DC

## 2016-03-16 MED ORDER — PREDNISONE 20 MG PO TABS: ORAL_TABLET | ORAL | Status: DC

## 2016-03-16 NOTE — Progress Notes (Signed)
   Subjective:    Patient ID: Zachary [name], male    DOB: [birth year], 55 y.o.   MRN: [id number]  HPI  CC: rash  # Rash:  Left forearm rash not improved all that much, but also not much worse  Now also has diffuse papular rash around torso, several areas on legs and feet  Somewhat itchy but not terrible  Has been using fluocinonide gel with some relief  Rash on forearm "bubbles" when he uses the gel and ruptures -- he is convinced this is poison/contact from plants when he was working outside  He reports that he is still taking the bactrim medicine --- was taking one tablet twice a day and not 2 tablets twice a day. ROS: no fevers, no nausea or vomiting,  Social Hx: never smoker  Review of Systems   See HPI for ROS.   Past medical history, surgical, family, and social history reviewed and updated in the EMR as appropriate. Objective:  BP 112/81 mmHg  Pulse 77  Temp(Src) 99 F (37.2 C) (Oral)  Ht 6\' 1"  (1.854 m)  Wt 199 lb (90.266 kg)  BMI 26.26 kg/m2  SpO2 98% Vitals and nursing note reviewed  General: no apparent distress  Skin: left forearm scattered papules approx 66mm, there are several vesicular and several ruptured areas as well. Throughout body and torso there is scattered red papules, not painful or itchy without fluid. Right calf cellulitis continues to improve, the I&D incision is healing well with a smaller area of induration present today and still no fluctuance.   Assessment & Plan:   1. Rash and nonspecific skin eruption Seems to be having 2 different rashes currently, left forearm does seem more consistent with a contact/poison ivy type rash whereas body is more diffuse and may be consistent with drug rash. Of note he is still takign the bactrim because he was only taking 1 tablet twice daily. I instructed him to discontinue the bactrim today. We will start prednisone and refill given for fluocinide ointment. Follow up after prednisone if not improving,  or call dermatologist for appointment.

## 2016-03-16 NOTE — Patient Instructions (Addendum)
STOP the bactrim antibiotic. Keep an eye the right leg.  Start the prednisone, take for 9 days total: 60mg  for 3 days, then 40mg  for 3 days, then 20mg  for 3 days  Follow up if not relieved after stopping the antibiotic and finishing the prednisone.

## 2016-04-18 IMAGING — US US EXTREM LOW VENOUS*L*
1 series · 14 of 24 positions shown · non-contrast
Comparison: None.

CLINICAL DATA: Left calf pain starting today.

EXAM:
LEFT LOWER EXTREMITY VENOUS DOPPLER ULTRASOUND
TECHNIQUE: Gray-scale sonography with graded compression, as well as color
Doppler and duplex ultrasound, were performed to evaluate the deep
venous system from the level of the common femoral vein through the
popliteal and proximal calf veins. Spectral Doppler was utilized to
evaluate flow at rest and with distal augmentation maneuvers.

[Series 1: us extrem low venous*left* · 0.08mm/px · 14 of 28 slices shown]
[im 1/28]
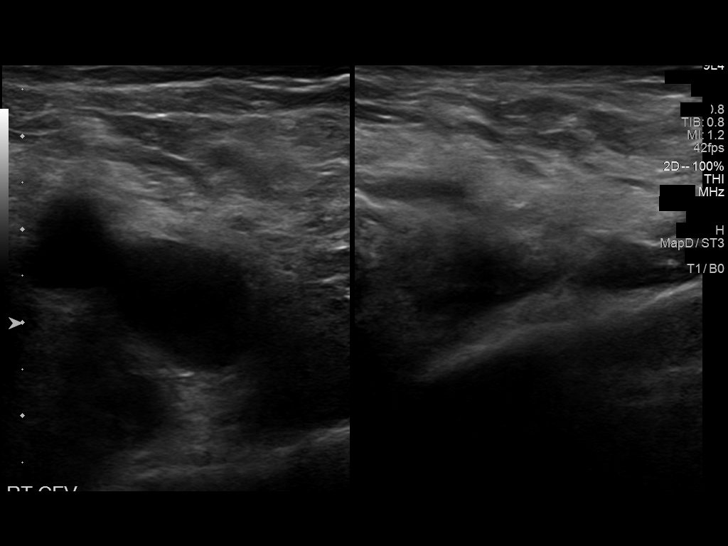
[im 3/28]
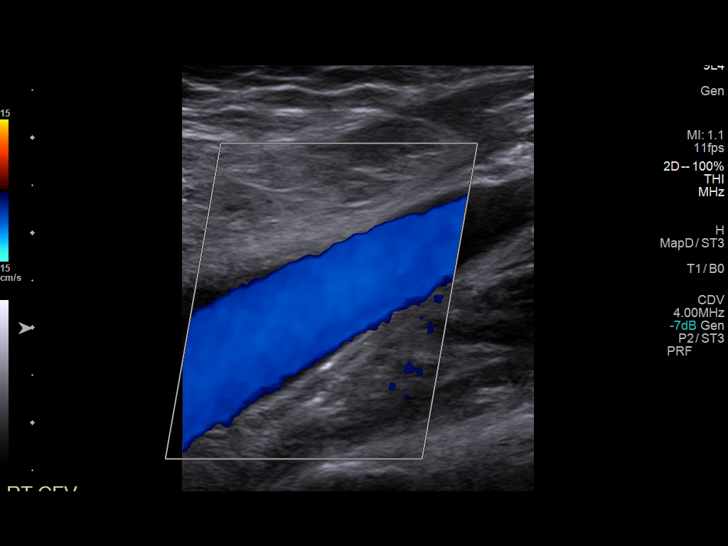
[im 5/28]
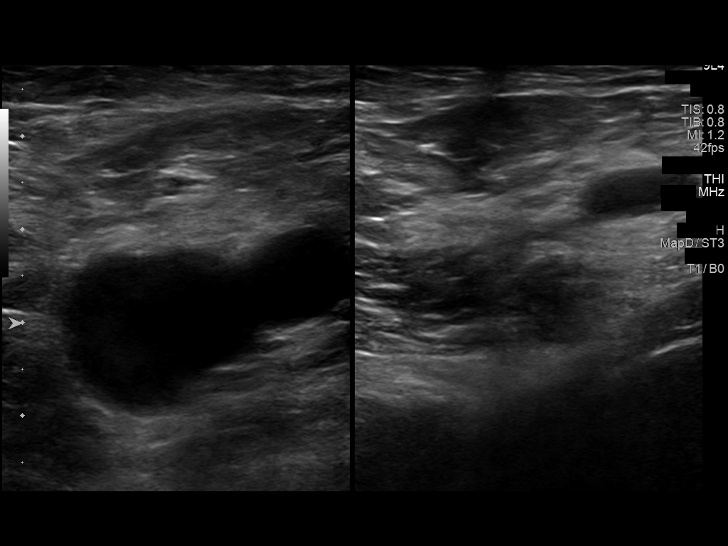
[im 8/28]
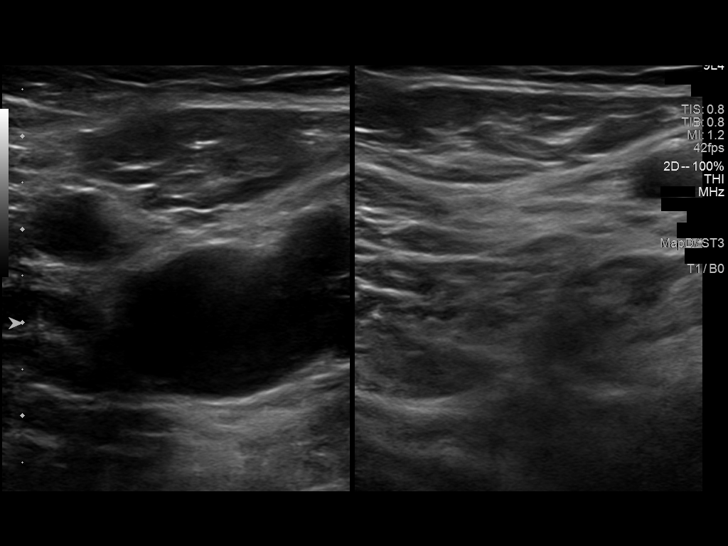
[im 9/28]
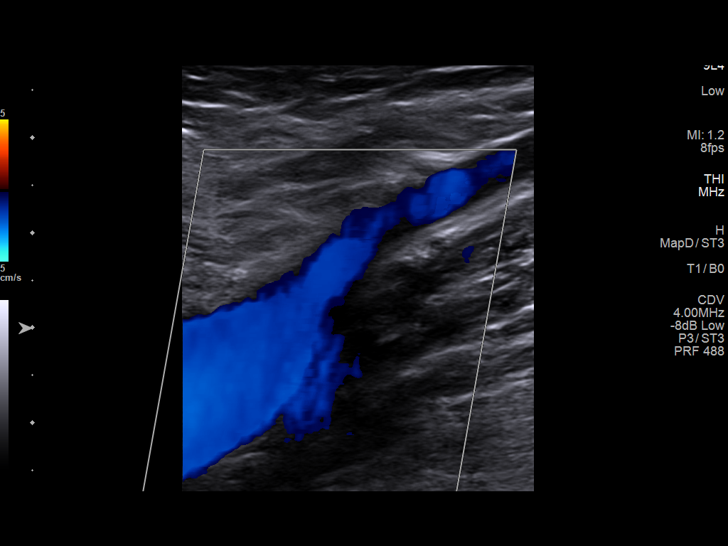
[im 11/28]
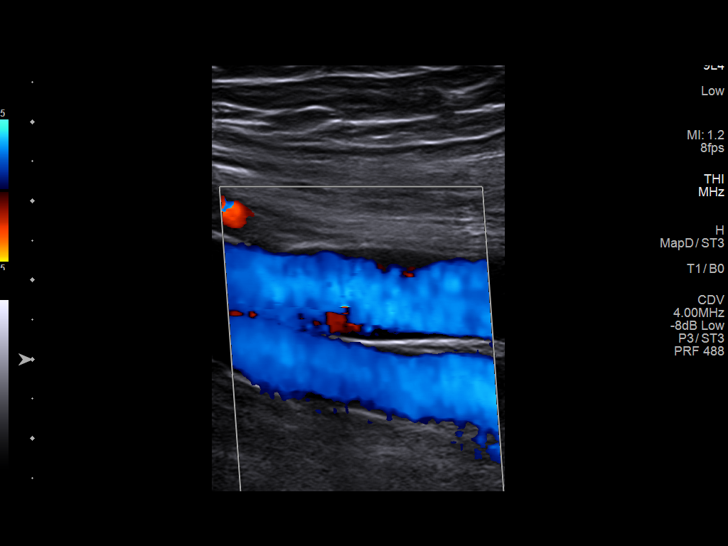
[im 13/28]
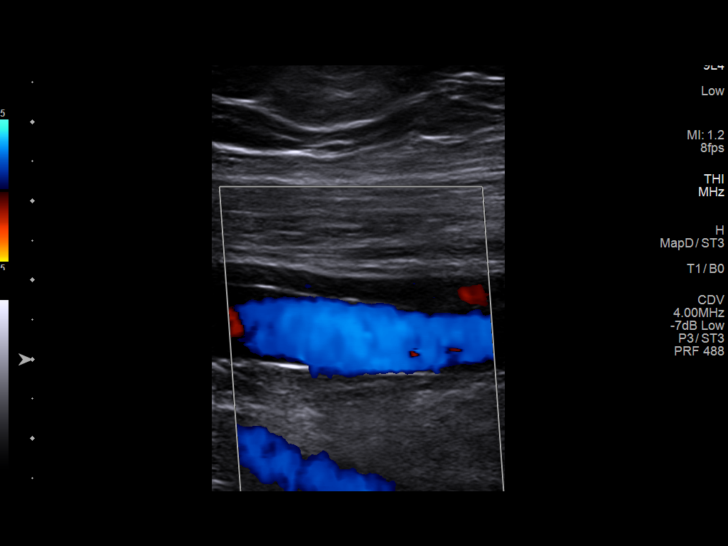
[im 15/28]
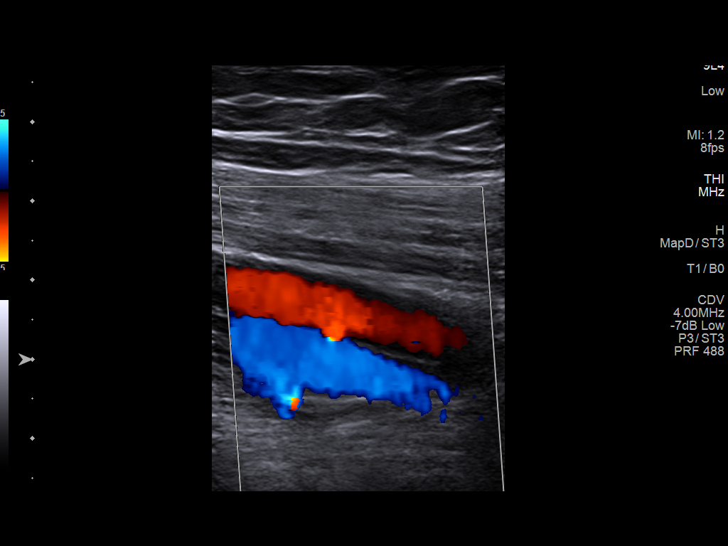
[im 17/28]
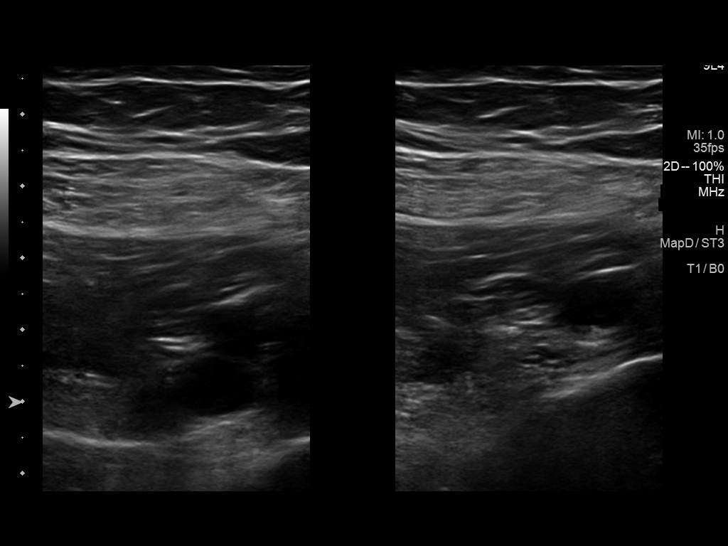
[im 19/28]
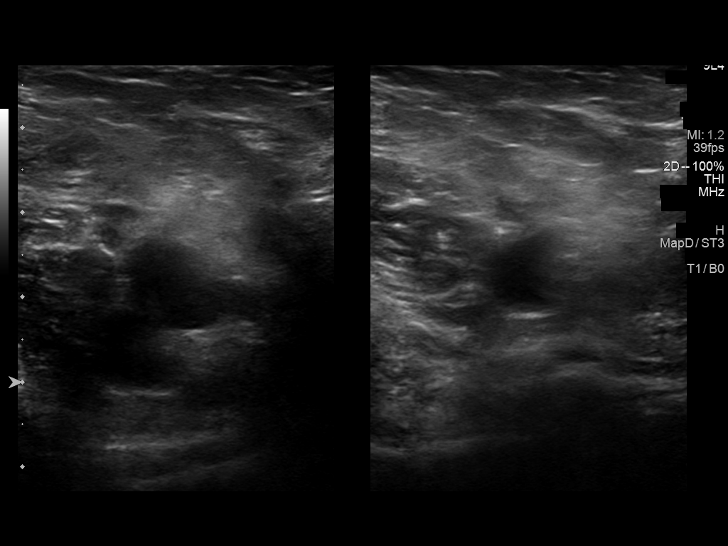
[im 22/28]
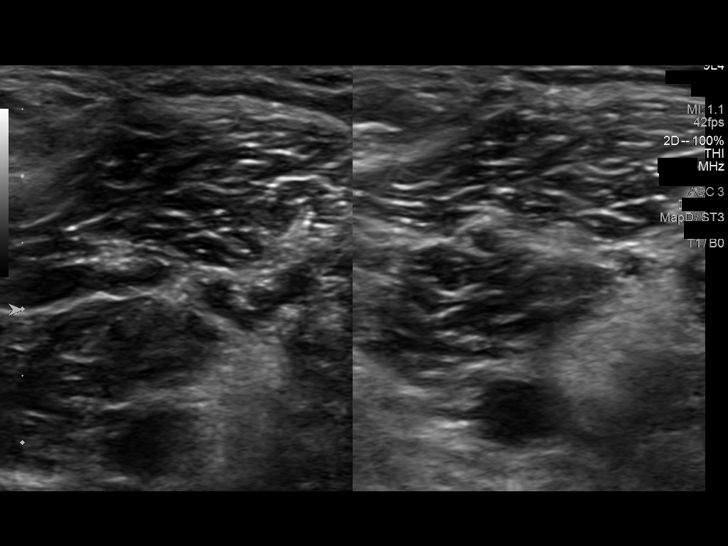
[im 23/28]
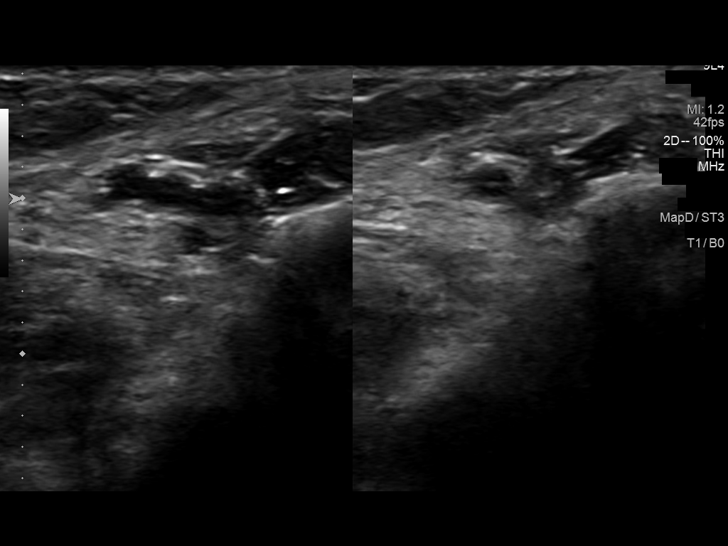
[im 25/28]
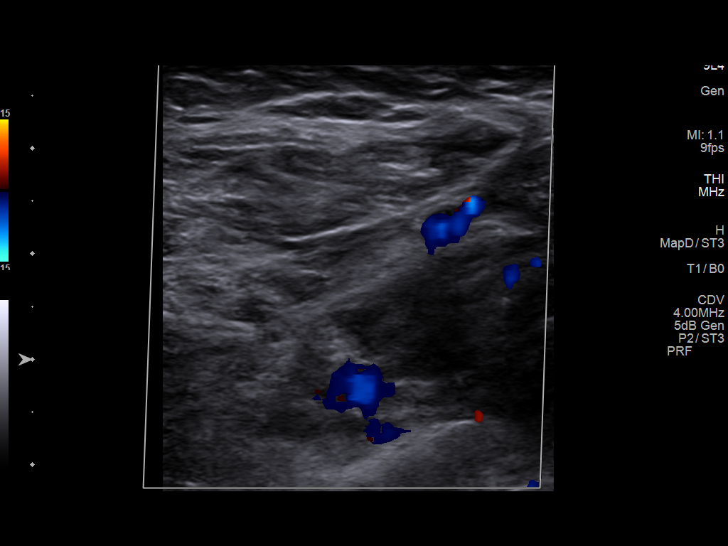
[im 28/28]
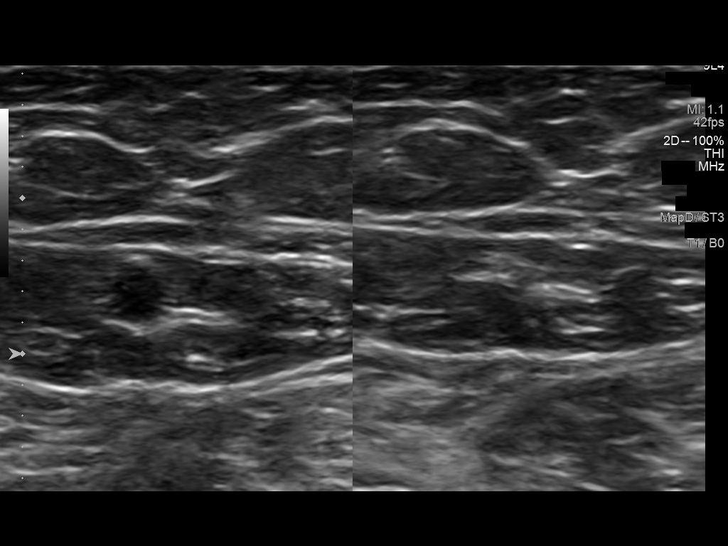

[14 of 24 positions shown; findings below may reference images not displayed]

FINDINGS: Right common femoral vein is compressible and patent.

Normal compressibility, augmentation and color Doppler flow in the
left common femoral vein, left femoral vein and left popliteal vein.
The left saphenofemoral junction is patent. Visualized deep left
calf veins are patent. Left great saphenous vein is compressible.
IMPRESSION: Negative for deep vein thrombosis in the left lower extremity.

## 2016-08-19 ENCOUNTER — Encounter: Payer: Self-pay | Admitting: Family Medicine

## 2016-08-19 ENCOUNTER — Ambulatory Visit (INDEPENDENT_AMBULATORY_CARE_PROVIDER_SITE_OTHER): Payer: Medicare Other | Admitting: Family Medicine

## 2016-08-19 ENCOUNTER — Other Ambulatory Visit: Payer: Self-pay | Admitting: Internal Medicine

## 2016-08-19 VITALS — BP 106/74 | HR 75 | Temp 97.3°F | Wt 207.0 lb

## 2016-08-19 DIAGNOSIS — N411 Chronic prostatitis: Secondary | ICD-10-CM

## 2016-08-19 LAB — POCT URINALYSIS DIPSTICK
BILIRUBIN UA: NEGATIVE
GLUCOSE UA: NEGATIVE
KETONES UA: NEGATIVE
LEUKOCYTES UA: NEGATIVE
Nitrite, UA: NEGATIVE
PH UA: 6.5
Protein, UA: NEGATIVE
Spec Grav, UA: 1.025
Urobilinogen, UA: 0.2

## 2016-08-19 LAB — POCT UA - MICROSCOPIC ONLY

## 2016-08-19 MED ORDER — TAMSULOSIN HCL 0.4 MG PO CAPS: 0.4000 mg | ORAL_CAPSULE | Freq: Every day | ORAL | 1 refills | Status: DC

## 2016-08-19 MED ORDER — MELOXICAM 15 MG PO TABS: 15.0000 mg | ORAL_TABLET | Freq: Every day | ORAL | 0 refills | Status: DC

## 2016-08-19 NOTE — Patient Instructions (Signed)
We will check your urine for infection today.   Please restart the flomax.  We will send in an anti-inflammatory medication today.  We will send a referral to the urologist.  Come back to see your primary care doctor for a regular appointment soon.  Take care,  Dr Jerline Pain

## 2016-08-19 NOTE — Assessment & Plan Note (Signed)
Symptoms likely related to his chronic prostatitis. Will check UA and urine culture today. Restart flomax. Gave rx for mobic for pain. Will treat with cipro if culture comes back positive. Referral to urology placed.

## 2016-08-19 NOTE — Progress Notes (Signed)
   Subjective:  Zachary [name] is a 55 y.o. male who presents to the [facility] today with a chief complaint of rectal pain.   HPI:  Rectal Pain Patient has a history of chronic rectal pain that has been evaluated by GI and urology in the past and thought to be secondary to chronic prostatitis. He has noticed an acute worsening over the past 3-4 weeks associated with increased pain and pressure in that area. Pain is decribed as a burning type sensation. HE has not tried any specific treatments. Pain is work with sitting for long periods of time. He does notice some sharp pain in his lower abdomen while urinating. No fevers. No malaise. HE is not currently taking any medications.   ROS: Per HPI  PMH: Smoking history reviewed.    Objective:  Physical Exam: BP 106/74   Pulse 75   Temp 97.3 F (36.3 C) (Oral)   Wt 207 lb (93.9 kg)   BMI 27.31 kg/m   Gen: NAD, resting comfortably Pulm: NWOB GI: Normal bowel sounds present. Soft, Nontender, Nondistended. GU: Deferred.  MSK: no edema, cyanosis, or clubbing noted Skin: warm, dry Neuro: grossly normal, moves all extremities Psych: Normal affect and thought content  Assessment/Plan:  Chronic prostatitis Symptoms likely related to his chronic prostatitis. Will check UA and urine culture today. Restart flomax. Gave rx for mobic for pain. Will treat with cipro if culture comes back positive. Referral to urology placed.   Algis Greenhouse. Jerline Pain, [place] Resident PGY-3 08/19/2016 1:33 PM

## 2016-08-19 NOTE — Telephone Encounter (Signed)
Pt states he was suppose to have two Rx's called in for him, but pt states the pharmacy does not have them. Pt uses [facility] [address] [place]. Please advise. Thanks! ep

## 2016-08-20 LAB — URINE CULTURE: Organism ID, Bacteria: NO GROWTH

## 2016-08-20 NOTE — Telephone Encounter (Signed)
Rx's sent in yesterday afternoon. Fleeger, Salome Spotted, CMA

## 2016-08-23 ENCOUNTER — Encounter: Payer: Self-pay | Admitting: Family Medicine

## 2016-08-28 ENCOUNTER — Telehealth (HOSPITAL_BASED_OUTPATIENT_CLINIC_OR_DEPARTMENT_OTHER): Payer: Self-pay

## 2016-08-28 ENCOUNTER — Emergency Department (HOSPITAL_COMMUNITY)
Admission: EM | Admit: 2016-08-28 | Discharge: 2016-08-28 | Disposition: A | Discharge status: Home / Self Care | Payer: Medicare Other | Attending: Emergency Medicine | Admitting: Emergency Medicine

## 2016-08-28 ENCOUNTER — Encounter (HOSPITAL_COMMUNITY): Payer: Self-pay

## 2016-08-28 DIAGNOSIS — K6289 Other specified diseases of anus and rectum: Secondary | ICD-10-CM | POA: Insufficient documentation

## 2016-08-28 LAB — CBC WITH DIFFERENTIAL/PLATELET
BASOS ABS: 0 10*3/uL (ref 0.0–0.1)
Basophils Relative: 0 %
Eosinophils Absolute: 0.1 10*3/uL (ref 0.0–0.7)
Eosinophils Relative: 2 %
HEMATOCRIT: 41.8 % (ref 39.0–52.0)
HEMOGLOBIN: 14.3 g/dL (ref 13.0–17.0)
LYMPHS ABS: 2.3 10*3/uL (ref 0.7–4.0)
Lymphocytes Relative: 40 %
MCH: 30.6 pg (ref 26.0–34.0)
MCHC: 34.2 g/dL (ref 30.0–36.0)
MCV: 89.3 fL (ref 78.0–100.0)
Monocytes Absolute: 0.5 10*3/uL (ref 0.1–1.0)
Monocytes Relative: 8 %
NEUTROS ABS: 2.9 10*3/uL (ref 1.7–7.7)
Neutrophils Relative %: 50 %
Platelets: 218 10*3/uL (ref 150–400)
RBC: 4.68 MIL/uL (ref 4.22–5.81)
RDW: 13.5 % (ref 11.5–15.5)
WBC: 5.8 10*3/uL (ref 4.0–10.5)

## 2016-08-28 LAB — BASIC METABOLIC PANEL
ANION GAP: 6 (ref 5–15)
BUN: 26 mg/dL — AB (ref 6–20)
CHLORIDE: 106 mmol/L (ref 101–111)
CO2: 24 mmol/L (ref 22–32)
Calcium: 9.2 mg/dL (ref 8.9–10.3)
Creatinine, Ser: 1.02 mg/dL (ref 0.61–1.24)
GFR calc Af Amer: >60 mL/min (ref >60)
GFR calc non Af Amer: >60 mL/min (ref >60)
GLUCOSE: 96 mg/dL (ref 65–99)
Potassium: 4 mmol/L (ref 3.5–5.1)
Sodium: 136 mmol/L (ref 135–145)

## 2016-08-28 LAB — URINALYSIS, ROUTINE W REFLEX MICROSCOPIC
Bilirubin Urine: NEGATIVE
GLUCOSE, UA: NEGATIVE mg/dL
HGB URINE DIPSTICK: NEGATIVE
Ketones, ur: NEGATIVE mg/dL
LEUKOCYTES UA: NEGATIVE
Nitrite: NEGATIVE
PROTEIN: NEGATIVE mg/dL
SPECIFIC GRAVITY, URINE: 1.017 (ref 1.005–1.030)
pH: 7 (ref 5.0–8.0)

## 2016-08-28 LAB — POC OCCULT BLOOD, ED: Fecal Occult Bld: NEGATIVE

## 2016-08-28 MED ORDER — CIPROFLOXACIN HCL 500 MG PO TABS: 500.0000 mg | ORAL_TABLET | Freq: Two times a day (BID) | ORAL | 0 refills | Status: DC

## 2016-08-28 MED ORDER — LIDOCAINE HCL 2 % EX GEL
1.0000 "application " | Freq: Once | CUTANEOUS | Status: AC
  Administered 2016-08-28: 1 via TOPICAL
  Filled 2016-08-28: qty 20

## 2016-08-28 MED ORDER — HYDROCODONE-ACETAMINOPHEN 5-325 MG PO TABS: ORAL_TABLET | ORAL | 0 refills | Status: DC

## 2016-08-28 MED ORDER — CIPROFLOXACIN HCL 500 MG PO TABS
500.0000 mg | ORAL_TABLET | Freq: Once | ORAL | Status: AC
  Administered 2016-08-28: 500 mg via ORAL
  Filled 2016-08-28: qty 1

## 2016-08-28 MED ORDER — HYDROCODONE-ACETAMINOPHEN 5-325 MG PO TABS
1.0000 | ORAL_TABLET | Freq: Once | ORAL | Status: AC
  Administered 2016-08-28: 1 via ORAL
  Filled 2016-08-28: qty 1

## 2016-08-28 NOTE — ED Provider Notes (Signed)
[facility] DEPT Provider Note   CSN: [id number] Arrival date & time: 08/28/16  [id number]     History   Chief Complaint Chief Complaint  Patient presents with  . GI Problem    HPI   Blood pressure 117/75, pulse 70, temperature 97.3 F (36.3 C), temperature source Oral, resp. rate 20, SpO2 96 %.  Zachary [name] is a 55 y.o. male complaining of rectal pain and burning worsening over the course of the last 3 weeks. He has chronic low back pain which is unchanged in nature and severity. Saw his primary care physician for this and was given a prescription for Flomax with little relief. He denies abdominal pain fever, chills, diarrhea, melena, hematochezia. He does state that he has urinary pressure with no dysuria or hematuria. Urinalysis and urine culture at PCP several days ago was negative. Had colonoscopy in 2013 which as per patient was normal except for several polyps. He tried to make an appointment with his gastroenterologist was but was informed that he had retired. He did have problems with chronic prostatitis but hasn't had any issues with that in several years. He is not on any antibiotics for this currently. She can get an appointment with urology until February, cannot get an appointment with gastroenterology until the end of December.  Past Medical History:  Diagnosis Date  . Chronic back pain   . Depression   . Diverticulosis 2006  . History of depression   . History of pneumothorax   . Lipoma   . Rectal pain   . Right leg DVT august 2012    Patient Active Problem List   Diagnosis Date Noted  . Cellulitis of right leg 02/24/2016  . Chronic prostatitis 01/13/2015  . Perineal pain in male 11/11/2014  . Rectal pain   . Cough 06/03/2014  . BPH (benign prostatic hyperplasia) 10/15/2013  . Recurrent UTI 10/15/2013  . Pain of left calf 06/27/2013  . Urinary hesitancy 10/19/2012  . Back pain, chronic 08/16/2012  . History of colonic polyps 08/09/2012  . Multiple  lipomas 07/10/2012  . Dyslipidemia 07/02/2011    Past Surgical History:  Procedure Laterality Date  . CHEST TUBE INSERTION     for pneumothorax  . CHOLECYSTECTOMY  2005  . CYSTECTOMY  2010, 2011  . INGUINAL HERNIA REPAIR  2002, 2003   x2  . LIPOMA EXCISION  10/28/2011   Procedure: MINOR EXCISION LIPOMA;  Surgeon: Rolm Bookbinder, MD;  Location: [facility];  Service: General;  Laterality: N/A;  2 back lipoma excison  1 x 2 cm subcutaneous, 3 x4 cm subcutaneous  . SMALL INTESTINE SURGERY    . Spinal Steroid injections         Home Medications    Prior to Admission medications   Medication Sig Start Date End Date Taking? Authorizing Provider  meloxicam (MOBIC) 15 MG tablet Take 1 tablet (15 mg total) by mouth daily. 08/19/16  Yes Vivi Barrack, MD  tamsulosin (FLOMAX) 0.4 MG CAPS capsule Take 1 capsule (0.4 mg total) by mouth daily. 08/19/16  Yes Vivi Barrack, MD  ciprofloxacin (CIPRO) 500 MG tablet Take 1 tablet (500 mg total) by mouth every 12 (twelve) hours. 08/28/16   Delon Revelo, PA-C  fluocinonide ointment (LIDEX) AB-123456789 % Apply 1 application topically 2 (two) times daily. Patient not taking: Reported on 08/28/2016 03/16/16   Leone Brand, MD  HYDROcodone-acetaminophen (NORCO/VICODIN) 5-325 MG tablet Take 1-2 tablets by mouth every 6 hours as needed for pain. 08/28/16  Elmyra Ricks Lenay Lovejoy, PA-C    Family History Family History  Problem Relation Age of Onset  . Stroke Father   . Heart attack Mother   . Heart disease Mother     heart attack  . Colon cancer Neg Hx   . Stomach cancer Neg Hx     Social History Social History  Substance Use Topics  . Smoking status: Never Smoker  . Smokeless tobacco: Never Used  . Alcohol use No     Allergies   Review of patient's allergies indicates no known allergies.   Review of Systems Review of Systems  10 systems reviewed and found to be negative, except as noted in the HPI.   Physical Exam Updated  Vital Signs BP 105/67   Pulse 60   Temp 97.3 F (36.3 C) (Oral)   Resp 20   SpO2 95%   Physical Exam  Constitutional: He is oriented to person, place, and time. He appears well-developed and well-nourished. No distress.  HENT:  Head: Normocephalic and atraumatic.  Mouth/Throat: Oropharynx is clear and moist.  Eyes: Conjunctivae and EOM are normal. Pupils are equal, round, and reactive to light.  Neck: Normal range of motion.  Cardiovascular: Normal rate, regular rhythm and intact distal pulses.   Pulmonary/Chest: Effort normal and breath sounds normal. No respiratory distress. He has no wheezes. He has no rales. He exhibits no tenderness.  Abdominal: Soft. He exhibits no distension and no mass. There is no tenderness. There is no rebound and no guarding. No hernia.  Genitourinary:  Genitourinary Comments: Digital rectal exam a chaperoned by technician: No rashes, seizures or external hemorrhoids. Small tag. Normal rectal tone, normal stool color, no focal prostatic pain.  Musculoskeletal: Normal range of motion.  Neurological: He is alert and oriented to person, place, and time.  Skin: He is not diaphoretic.  Psychiatric: He has a normal mood and affect.  Nursing note and vitals reviewed.    ED Treatments / Results  Labs (all labs ordered are listed, but only abnormal results are displayed) Labs Reviewed  BASIC METABOLIC PANEL - Abnormal; Notable for the following:       Result Value   BUN 26 (*)    All other components within normal limits  URINALYSIS, ROUTINE W REFLEX MICROSCOPIC (NOT AT [facility])  CBC WITH DIFFERENTIAL/PLATELET  POC OCCULT BLOOD, ED    EKG  EKG Interpretation None       Radiology No results found.  Procedures Procedures (including critical care time)  Medications Ordered in ED Medications  ciprofloxacin (CIPRO) tablet 500 mg (not administered)  HYDROcodone-acetaminophen (NORCO/VICODIN) 5-325 MG per tablet 1 tablet (1 tablet Oral Given 08/28/16  1052)  lidocaine (XYLOCAINE) 2 % jelly 1 application (1 application Topical Given 08/28/16 1052)     Initial Impression / Assessment and Plan / ED Course  I have reviewed the triage vital signs and the nursing notes.  Pertinent labs & imaging results that were available during my care of the patient were reviewed by me and considered in my medical decision making (see chart for details).  Clinical Course    Vitals:   08/28/16 0932 08/28/16 1030  BP: 117/75 105/67  Pulse: 70 60  Resp: 20 20  Temp: 97.3 F (36.3 C)   TempSrc: Oral   SpO2: 96% 95%    Medications  ciprofloxacin (CIPRO) tablet 500 mg (not administered)  HYDROcodone-acetaminophen (NORCO/VICODIN) 5-325 MG per tablet 1 tablet (1 tablet Oral Given 08/28/16 1052)  lidocaine (XYLOCAINE) 2 % jelly 1  application (1 application Topical Given 08/28/16 1052)    Zachary [name] is 55 y.o. male presenting with Rectal pain worsening over the last several days. Patient afebrile, overall well appearing with no change in stool patterns, no abdominal pain.  Urinalysis at primary care was negative, he was started on Flomax with little relief.  Digital rectal exam today with no focal prostatic pain. Workup reassuring, we'll treat for presumed aseptic prostatitis with Cipro, patient will follow closely with both urologist and gastroenterology.  In shared decision-making we have discussed the pros and cons of obtaining CT in the ED, will hold off on imaging for now because this is not consistent with diverticulitis/diverticulosis that would cause rectal pain.  This is a shared visit with the attending physician who personally evaluated the patient and agrees with the care plan.   Evaluation does not show pathology that would require ongoing emergent intervention or inpatient treatment. Pt is hemodynamically stable and mentating appropriately. Discussed findings and plan with patient/guardian, who agrees with care plan. All questions  answered. Return precautions discussed and outpatient follow up given.    Final Clinical Impressions(s) / ED Diagnoses   Final diagnoses:  Rectal pain    New Prescriptions New Prescriptions   CIPROFLOXACIN (CIPRO) 500 MG TABLET    Take 1 tablet (500 mg total) by mouth every 12 (twelve) hours.   HYDROCODONE-ACETAMINOPHEN (NORCO/VICODIN) 5-325 MG TABLET    Take 1-2 tablets by mouth every 6 hours as needed for pain.     Monico Blitz, PA-C 08/28/16 [place], MD 08/28/16 1351

## 2016-08-28 NOTE — Discharge Instructions (Signed)
Please follow with your primary care doctor in the next 2 days for a check-up. They must obtain records for further management.  ° °Do not hesitate to return to the Emergency Department for any new, worsening or concerning symptoms.  ° °

## 2016-08-28 NOTE — ED Triage Notes (Addendum)
Pt c/o "colon pressure/pain" for several months, came today because the pain has not stopped. Hx diverticulitis. Denies n/v/d. Denies blood in stool. Recently seen by Dr. Jerline Pain for same.

## 2016-09-23 ENCOUNTER — Other Ambulatory Visit (INDEPENDENT_AMBULATORY_CARE_PROVIDER_SITE_OTHER): Payer: Medicare Other

## 2016-09-23 ENCOUNTER — Ambulatory Visit (INDEPENDENT_AMBULATORY_CARE_PROVIDER_SITE_OTHER): Payer: Medicare Other | Admitting: Gastroenterology

## 2016-09-23 ENCOUNTER — Encounter (INDEPENDENT_AMBULATORY_CARE_PROVIDER_SITE_OTHER): Payer: Self-pay

## 2016-09-23 ENCOUNTER — Telehealth: Payer: Self-pay | Admitting: Gastroenterology

## 2016-09-23 ENCOUNTER — Encounter: Payer: Self-pay | Admitting: Gastroenterology

## 2016-09-23 VITALS — BP 108/70 | HR 72 | Ht 69.0 in | Wt 213.0 lb

## 2016-09-23 DIAGNOSIS — K6289 Other specified diseases of anus and rectum: Secondary | ICD-10-CM | POA: Diagnosis not present

## 2016-09-23 DIAGNOSIS — K649 Unspecified hemorrhoids: Secondary | ICD-10-CM

## 2016-09-23 LAB — URINALYSIS
Bilirubin Urine: NEGATIVE
Ketones, ur: NEGATIVE
Leukocytes, UA: NEGATIVE
Nitrite: NEGATIVE
Specific Gravity, Urine: 1.025 (ref 1.000–1.030)
TOTAL PROTEIN, URINE-UPE24: NEGATIVE
URINE GLUCOSE: NEGATIVE
Urobilinogen, UA: 0.2 (ref 0.0–1.0)
pH: 5.5 (ref 5.0–8.0)

## 2016-09-23 MED ORDER — AMBULATORY NON FORMULARY MEDICATION: 0 refills | Status: DC

## 2016-09-23 NOTE — Progress Notes (Signed)
HPI :  55 year old male previously followed by Dr. Deatra Ina and Nicoletta Ba, here for a follow up visit for rectal pressure/discomfort. In review of his chart he has had this sensation for the past few years. Should a colonoscopy in January 2016 which showed 3 adenomas the largest was 1 cm in size. No other pathology noted at that time. CT pelvis in 12/2014 showed mild sigmoid diverticulosis, R gluteal lipoma. He was referred to Urology for possible chronic prostatitis.   He reports chronic back pain from injury in 2007, and had a lot of cortizone injection. He is disabled due to chronic pain.  He has had a history of diverticulitis s/p surgical resection in 2006.   He endorses chronic pressure and burning in his anal area / rectum. He reports if he eats foods with seeds it can bother him. He has had a fall and worsening pain in his lower back, could not walk for a few days. He was using hydrocodone for pain. He has had some burning / pressure in his rectum. He was told he had prostatitis and given tamsulosin. HE is not sure it it has helped.  He endorses pressure and burning in his anal area / rectum. He feels it all the time. He thinks this has been ongoing for a while and then stopped, and now bothering him again for the past 2 months. He has a bowel movement 2-3 times per day. He denies any straining or passing hard stools. NO blood in the stools. He thinks he feels hemorrhoids in the area which can prolapse. He denies using any topical agents other than soap to clean himself. He denies any changes with urination. He had a UA 2 weeks ago in the ER and denies any infection in the urine. He also endorses pressure in his penis when he awakes in the morning, he wakes up multiple times at night to urinate. He was seen by Urology in the past without clear etiology. Sitting makes his symptoms worse.   Past Medical History:  Diagnosis Date  . Chronic back pain   . Depression   . Diverticulosis 2006  .  History of depression   . History of pneumothorax   . Lipoma   . Rectal pain   . Right leg DVT august 2012     Past Surgical History:  Procedure Laterality Date  . CHEST TUBE INSERTION     for pneumothorax  . CHOLECYSTECTOMY  2005  . CYSTECTOMY  2010, 2011  . INGUINAL HERNIA REPAIR  2002, 2003   x2  . LIPOMA EXCISION  10/28/2011   Procedure: MINOR EXCISION LIPOMA;  Surgeon: Rolm Bookbinder, MD;  Location: [facility];  Service: General;  Laterality: N/A;  2 back lipoma excison  1 x 2 cm subcutaneous, 3 x4 cm subcutaneous  . SMALL INTESTINE SURGERY    . Spinal Steroid injections     Family History  Problem Relation Age of Onset  . Stroke Father   . Heart attack Mother   . Heart disease Mother     heart attack  . Colon cancer Neg Hx   . Stomach cancer Neg Hx    Social History  Substance Use Topics  . Smoking status: Never Smoker  . Smokeless tobacco: Never Used  . Alcohol use No   Current Outpatient Prescriptions  Medication Sig Dispense Refill  . ciprofloxacin (CIPRO) 500 MG tablet Take 1 tablet (500 mg total) by mouth every 12 (twelve) hours. (Patient not taking:  Reported on 09/23/2016) 28 tablet 0  . fluocinonide ointment (LIDEX) AB-123456789 % Apply 1 application topically 2 (two) times daily. (Patient not taking: Reported on 09/23/2016) 30 g 0  . HYDROcodone-acetaminophen (NORCO/VICODIN) 5-325 MG tablet Take 1-2 tablets by mouth every 6 hours as needed for pain. (Patient not taking: Reported on 09/23/2016) 11 tablet 0  . meloxicam (MOBIC) 15 MG tablet Take 1 tablet (15 mg total) by mouth daily. (Patient not taking: Reported on 09/23/2016) 30 tablet 0  . tamsulosin (FLOMAX) 0.4 MG CAPS capsule Take 1 capsule (0.4 mg total) by mouth daily. (Patient not taking: Reported on 09/23/2016) 30 capsule 1   No current facility-administered medications for this visit.    No Known Allergies   Review of Systems: All systems reviewed and negative except where noted in HPI.    Lab Results  Component Value Date   WBC 5.8 08/28/2016   HGB 14.3 08/28/2016   HCT 41.8 08/28/2016   MCV 89.3 08/28/2016   PLT 218 08/28/2016    Lab Results  Component Value Date   CREATININE 1.02 08/28/2016   BUN 26 (H) 08/28/2016   NA 136 08/28/2016   K 4.0 08/28/2016   CL 106 08/28/2016   CO2 24 08/28/2016    Lab Results  Component Value Date   ALT 18 11/27/2014   AST 29 11/27/2014   ALKPHOS 73 11/27/2014   BILITOT 0.3 11/27/2014     Physical Exam: BP 108/70   Pulse 72   Ht 5\' 9"  (1.753 m)   Wt 213 lb (96.6 kg)   BMI 31.45 kg/m  Constitutional: Pleasant,well-developed, male in no acute distress. HEENT: Normocephalic and atraumatic. Conjunctivae are normal. No scleral icterus. Neck supple.  Cardiovascular: Normal rate, regular rhythm.  Pulmonary/chest: Effort normal and breath sounds normal. No wheezing, rales or rhonchi. Abdominal: Soft, nondistended, nontender. Bowel sounds active throughout. There are no masses palpable. No hepatomegaly. DRE with anoscopy normal external exam, internal hemorrhoids noted on anoscopy, no mass lesions, some tenderness with palpation of the prostate Extremities: no edema Lymphadenopathy: No cervical adenopathy noted. Neurological: Alert and oriented to person place and time. Skin: Skin is warm and dry. No rashes noted. Psychiatric: Normal mood and affect. Behavior is normal.   ASSESSMENT AND PLAN: 55 year old male with a history of a few years of intermittent rectal discomfort / burning as outlined above. He's been evaluated by both GI and Urology without a clear etiology, treated for prostatitis in the past with unclear benefit. He gets some mild tenderness of his prostate on exam today we will recheck UA to ensure no infection. He otherwise does have internal hemorrhoids noted on anoscopy although it also possible he has a component of spasm of the anal canal as well. We'll recommend a trial of Anusol for his hemorrhoids and a  trial of nitroglycerin ointment to be applied 3 times a day into the anal canal for treatment of component of spasm. If no improvement despite these measures he can contact me for reassessment. He agreed.   Total time spent with patient 30 minutes.   Mechanicsville Cellar, MD [facility] Pager [phone]

## 2016-09-23 NOTE — Patient Instructions (Addendum)
If you are age 56 or older, your body mass index should be between 23-30. Your Body mass index is 31.45 kg/m. If this is out of the aforementioned range listed, please consider follow up with your Primary Care Provider.  If you are age 8 or younger, your body mass index should be between 19-25. Your Body mass index is 31.45 kg/m. If this is out of the aformentioned range listed, please consider follow up with your Primary Care Provider.   Your physician has requested that you go to the basement for the following lab work before leaving today:  Urinalysis  Please purchase Anusol Suppository. (over the counter).  Use nighlty as directed.  If this is too expensive please call the office back and we can call in another medication.  Nitroglycerin 0.125% ointment was sent to [facility]. Apply a pea sized amount every 8 hours.  Thank you.

## 2016-09-24 ENCOUNTER — Telehealth: Payer: Self-pay

## 2016-09-24 NOTE — Telephone Encounter (Signed)
That's fine he can use the proctomed with applicator. Thanks

## 2016-09-24 NOTE — Telephone Encounter (Signed)
Pt has picked up the nitroglycerine oint from [facility]. I spoke with the pharmacist there (beverly) and the anusol suppository is very expensive.   Dr Havery Moros, What would you like for pt to try instead. The pharmacist suggested Proctomed 2.5% HC cream with applicator. Is this ok or do you want pt to pick something up over the counter? Thanks

## 2016-09-28 ENCOUNTER — Other Ambulatory Visit: Payer: Self-pay

## 2016-09-28 MED ORDER — HYDROCORTISONE 2.5 % RE CREA: 1.0000 "application " | TOPICAL_CREAM | Freq: Every day | RECTAL | 1 refills | Status: DC

## 2016-09-28 NOTE — Telephone Encounter (Signed)
-----   Message from Manus Gunning, MD sent at 09/23/2016  6:34 PM EST ----- Caryl Pina can you please let this patient know his UA is normal, no infection. We will await his course with the medications we provided. Thanks

## 2016-09-28 NOTE — Telephone Encounter (Signed)
Sent procto care HC rectal cream to [facility] in [place]. Pt to use nightly as directed.

## 2016-09-28 NOTE — Telephone Encounter (Signed)
Pt informed of normal UA results. Also informed of new rectal cream to use at night called to [facility] in [facility]. Pt will call back to give update.

## 2016-10-03 ENCOUNTER — Emergency Department (HOSPITAL_COMMUNITY)
Admission: EM | Admit: 2016-10-03 | Discharge: 2016-10-03 | Disposition: A | Discharge status: Home / Self Care | Payer: Medicare Other | Attending: Emergency Medicine | Admitting: Emergency Medicine

## 2016-10-03 ENCOUNTER — Emergency Department (HOSPITAL_COMMUNITY): Payer: Medicare Other

## 2016-10-03 ENCOUNTER — Encounter (HOSPITAL_COMMUNITY): Payer: Self-pay | Admitting: *Deleted

## 2016-10-03 DIAGNOSIS — R1010 Upper abdominal pain, unspecified: Secondary | ICD-10-CM | POA: Diagnosis present

## 2016-10-03 DIAGNOSIS — R1013 Epigastric pain: Secondary | ICD-10-CM | POA: Diagnosis not present

## 2016-10-03 LAB — COMPREHENSIVE METABOLIC PANEL
ALT: 30 U/L (ref 17–63)
ANION GAP: 10 (ref 5–15)
AST: 47 U/L — ABNORMAL HIGH (ref 15–41)
Albumin: 4.2 g/dL (ref 3.5–5.0)
Alkaline Phosphatase: 66 U/L (ref 38–126)
BUN: 21 mg/dL — ABNORMAL HIGH (ref 6–20)
CHLORIDE: 103 mmol/L (ref 101–111)
CO2: 22 mmol/L (ref 22–32)
CREATININE: 1.11 mg/dL (ref 0.61–1.24)
Calcium: 9 mg/dL (ref 8.9–10.3)
GFR calc non Af Amer: >60 mL/min (ref >60)
Glucose, Bld: 113 mg/dL — ABNORMAL HIGH (ref 65–99)
Potassium: 4.9 mmol/L (ref 3.5–5.1)
SODIUM: 135 mmol/L (ref 135–145)
Total Bilirubin: 1.2 mg/dL (ref 0.3–1.2)
Total Protein: 6.4 g/dL — ABNORMAL LOW (ref 6.5–8.1)

## 2016-10-03 LAB — CBC
HEMATOCRIT: 45.2 % (ref 39.0–52.0)
HEMOGLOBIN: 15.5 g/dL (ref 13.0–17.0)
MCH: 31.1 pg (ref 26.0–34.0)
MCHC: 34.3 g/dL (ref 30.0–36.0)
MCV: 90.8 fL (ref 78.0–100.0)
Platelets: 259 10*3/uL (ref 150–400)
RBC: 4.98 MIL/uL (ref 4.22–5.81)
RDW: 13.8 % (ref 11.5–15.5)
WBC: 6.9 10*3/uL (ref 4.0–10.5)

## 2016-10-03 LAB — URINALYSIS, ROUTINE W REFLEX MICROSCOPIC
Bilirubin Urine: NEGATIVE
GLUCOSE, UA: NEGATIVE mg/dL
HGB URINE DIPSTICK: NEGATIVE
Ketones, ur: NEGATIVE mg/dL
Leukocytes, UA: NEGATIVE
Nitrite: NEGATIVE
PH: 7 (ref 5.0–8.0)
Protein, ur: NEGATIVE mg/dL
SPECIFIC GRAVITY, URINE: 1.043 — AB (ref 1.005–1.030)

## 2016-10-03 LAB — LIPASE, BLOOD: LIPASE: 21 U/L (ref 11–51)

## 2016-10-03 LAB — I-STAT CG4 LACTIC ACID, ED
LACTIC ACID, VENOUS: 0.79 mmol/L (ref 0.5–1.9)
Lactic Acid, Venous: 1.93 mmol/L (ref 0.5–1.9)

## 2016-10-03 LAB — I-STAT TROPONIN, ED: Troponin i, poc: 0 ng/mL (ref 0.00–0.08)

## 2016-10-03 MED ORDER — TRAMADOL HCL 50 MG PO TABS: 50.0000 mg | ORAL_TABLET | Freq: Four times a day (QID) | ORAL | 0 refills | Status: DC | PRN

## 2016-10-03 MED ORDER — IOPAMIDOL (ISOVUE-300) INJECTION 61%
INTRAVENOUS | Status: AC
  Administered 2016-10-03: 100 mL
  Filled 2016-10-03: qty 100

## 2016-10-03 MED ORDER — CYCLOBENZAPRINE HCL 10 MG PO TABS: 10.0000 mg | ORAL_TABLET | Freq: Two times a day (BID) | ORAL | 0 refills | Status: DC | PRN

## 2016-10-03 MED ORDER — SODIUM CHLORIDE 0.9 % IV BOLUS (SEPSIS)
1000.0000 mL | Freq: Once | INTRAVENOUS | Status: AC
  Administered 2016-10-03: 1000 mL via INTRAVENOUS

## 2016-10-03 MED ORDER — MORPHINE SULFATE (PF) 4 MG/ML IV SOLN
4.0000 mg | Freq: Once | INTRAVENOUS | Status: AC
  Administered 2016-10-03: 4 mg via INTRAVENOUS
  Filled 2016-10-03: qty 1

## 2016-10-03 NOTE — ED Triage Notes (Signed)
Pt reports onset yesterday of upper abd pain, increases with movement and deep breathing. Denies n/v/d.

## 2016-10-03 NOTE — ED Provider Notes (Signed)
[facility] DEPT Provider Note   CSN: [id number] Arrival date & time: 10/03/16  0754     History   Chief Complaint Chief Complaint  Patient presents with  . Abdominal Pain    HPI Zachary [name] is a 55 y.o. male with a past medical history significant for prior pneumothorax, diverticulitis, constipation, and prior DVT who presents with abdominal pain. Patient reports that he has had upper abdominal pain since yesterday when he was lifting some heavy materials at work. He describes the pain going across his upper abdomen and is sharp in nature. He denies nausea, vomiting, any diarrhea, dysuria, or difficulty with bowel movements. He says it is worse with deep breathing but denies any chest pain or shortness of breath. He denies any cough. Discussed pain is extremely severe with any movement. He denies any other traumas.     The history is provided by the patient, medical records and the spouse.  Abdominal Pain   This is a new problem. The current episode started yesterday. The problem occurs constantly. The problem has not changed since onset.The pain is located in the epigastric region. The quality of the pain is sharp. The pain is moderate. Associated symptoms include nausea and constipation. Pertinent negatives include fever, diarrhea, vomiting, dysuria, hematuria and headaches. The symptoms are aggravated by certain positions, deep breathing and palpation. Nothing relieves the symptoms. Past medical history comments: diverticulitis.      Past Medical History:  Diagnosis Date  . Chronic back pain   . Depression   . Diverticulosis 2006  . History of depression   . History of pneumothorax   . Lipoma   . Rectal pain   . Right leg DVT august 2012    Patient Active Problem List   Diagnosis Date Noted  . Cellulitis of right leg 02/24/2016  . Chronic prostatitis 01/13/2015  . Perineal pain in male 11/11/2014  . Rectal pain   . Cough 06/03/2014  . BPH (benign prostatic  hyperplasia) 10/15/2013  . Recurrent UTI 10/15/2013  . Pain of left calf 06/27/2013  . Urinary hesitancy 10/19/2012  . Back pain, chronic 08/16/2012  . History of colonic polyps 08/09/2012  . Multiple lipomas 07/10/2012  . Dyslipidemia 07/02/2011    Past Surgical History:  Procedure Laterality Date  . CHEST TUBE INSERTION     for pneumothorax  . CHOLECYSTECTOMY  2005  . CYSTECTOMY  2010, 2011  . INGUINAL HERNIA REPAIR  2002, 2003   x2  . LIPOMA EXCISION  10/28/2011   Procedure: MINOR EXCISION LIPOMA;  Surgeon: Rolm Bookbinder, MD;  Location: [facility];  Service: General;  Laterality: N/A;  2 back lipoma excison  1 x 2 cm subcutaneous, 3 x4 cm subcutaneous  . SMALL INTESTINE SURGERY    . Spinal Steroid injections         Home Medications    Prior to Admission medications   Medication Sig Start Date End Date Taking? Authorizing Provider  AMBULATORY NON FORMULARY MEDICATION Nitroglycerine Ointment 0.125 % . Use a pea sized amount every 8 hours. 09/23/16   Manus Gunning, MD  ciprofloxacin (CIPRO) 500 MG tablet Take 1 tablet (500 mg total) by mouth every 12 (twelve) hours. Patient not taking: Reported on 09/23/2016 08/28/16   Elmyra Ricks Pisciotta, PA-C  fluocinonide ointment (LIDEX) [id number] % Apply 1 application topically 2 (two) times daily. Patient not taking: Reported on 09/23/2016 03/16/16   Leone Brand, MD  HYDROcodone-acetaminophen (NORCO/VICODIN) 5-325 MG tablet Take 1-2 tablets by mouth  every 6 hours as needed for pain. Patient not taking: Reported on 09/23/2016 08/28/16   Elmyra Ricks Pisciotta, PA-C  hydrocortisone (PROCTOCARE-HC) 2.5 % rectal cream Place 1 application rectally at bedtime. 09/28/16   Manus Gunning, MD  meloxicam (MOBIC) 15 MG tablet Take 1 tablet (15 mg total) by mouth daily. Patient not taking: Reported on 09/23/2016 08/19/16   Vivi Barrack, MD  tamsulosin (FLOMAX) 0.4 MG CAPS capsule Take 1 capsule (0.4 mg total) by mouth  daily. Patient not taking: Reported on 09/23/2016 08/19/16   Vivi Barrack, MD    Family History Family History  Problem Relation Age of Onset  . Stroke Father   . Heart attack Mother   . Heart disease Mother     heart attack  . Colon cancer Neg Hx   . Stomach cancer Neg Hx     Social History Social History  Substance Use Topics  . Smoking status: Never Smoker  . Smokeless tobacco: Never Used  . Alcohol use No     Allergies   Patient has no known allergies.   Review of Systems Review of Systems  Constitutional: Negative for activity change, chills, diaphoresis, fatigue and fever.  HENT: Negative for congestion and rhinorrhea.   Eyes: Negative for visual disturbance.  Respiratory: Negative for cough, chest tightness, shortness of breath, wheezing and stridor.   Cardiovascular: Negative for chest pain, palpitations and leg swelling.  Gastrointestinal: Positive for abdominal pain, constipation and nausea. Negative for abdominal distention, blood in stool, diarrhea and vomiting.  Genitourinary: Negative for difficulty urinating, dysuria, flank pain and hematuria.  Musculoskeletal: Negative for back pain and gait problem.  Skin: Negative for rash and wound.  Neurological: Negative for dizziness, weakness, light-headedness and headaches.  Psychiatric/Behavioral: Negative for agitation.  All other systems reviewed and are negative.    Physical Exam Updated Vital Signs BP 118/82 (BP Location: Right Arm)   Pulse 75   Temp 97.5 F (36.4 C) (Oral)   Resp 18   SpO2 99%   Physical Exam  Constitutional: He is oriented to person, place, and time. He appears well-developed and well-nourished.  HENT:  Head: Normocephalic and atraumatic.  Eyes: Conjunctivae are normal.  Neck: Neck supple.  Cardiovascular: Normal rate and regular rhythm.   No murmur heard. Pulmonary/Chest: Effort normal and breath sounds normal. No respiratory distress.  Abdominal: Soft. Bowel sounds are  normal. There is tenderness in the epigastric area. There is no rigidity, no rebound, no guarding and no CVA tenderness.    Musculoskeletal: He exhibits no edema.  Neurological: He is alert and oriented to person, place, and time. He displays normal reflexes. No cranial nerve deficit or sensory deficit. He exhibits normal muscle tone. Coordination normal.  Skin: Skin is warm and dry. Capillary refill takes less than 2 seconds. No erythema. No pallor.  Psychiatric: He has a normal mood and affect.  Nursing note and vitals reviewed.    ED Treatments / Results  Labs (all labs ordered are listed, but only abnormal results are displayed) Labs Reviewed  COMPREHENSIVE METABOLIC PANEL - Abnormal; Notable for the following:       Result Value   Glucose, Bld 113 (*)    BUN 21 (*)    Total Protein 6.4 (*)    AST 47 (*)    All other components within normal limits  URINALYSIS, ROUTINE W REFLEX MICROSCOPIC - Abnormal; Notable for the following:    Color, Urine AMBER (*)    Specific Gravity, Urine 1.043 (*)  All other components within normal limits  I-STAT CG4 LACTIC ACID, ED - Abnormal; Notable for the following:    Lactic Acid, Venous 1.93 (*)    All other components within normal limits  LIPASE, BLOOD  CBC  I-STAT TROPOININ, ED  I-STAT CG4 LACTIC ACID, ED    EKG  EKG Interpretation  Date/Time:  Sunday October 03 2016 08:06:00 EST Ventricular Rate:  80 PR Interval:  174 QRS Duration: 80 QT Interval:  374 QTC Calculation: 431 R Axis:   63 Text Interpretation:  Normal sinus rhythm Normal ECG When compared with ECG of 03/14/2015, No significant change was found Confirmed by [facility]  MD, DAVID ([id number]) on 10/03/2016 8:05:23 AM       Radiology Dg Chest 2 View  Result Date: 10/03/2016 CLINICAL DATA:  Chest pain, epigastric pain, history diverticulitis EXAM: CHEST  2 VIEW COMPARISON:  03/14/2015 FINDINGS: Normal heart size, mediastinal contours, and pulmonary vascularity. Lungs  clear. No pleural effusion or pneumothorax. Bones unremarkable. IMPRESSION: Normal exam. Electronically Signed   By: Lavonia Dana M.D.   On: 10/03/2016 09:30   Ct Abdomen Pelvis W Contrast  Result Date: 10/03/2016 CLINICAL DATA:  Abdominal pain. EXAM: CT ABDOMEN AND PELVIS WITH CONTRAST TECHNIQUE: Multidetector CT imaging of the abdomen and pelvis was performed using the standard protocol following bolus administration of intravenous contrast. CONTRAST:  160mL ISOVUE-300 IOPAMIDOL (ISOVUE-300) INJECTION 61% COMPARISON:  September 15, 2012 FINDINGS: Lower chest: No acute abnormality. Hepatobiliary: The patient is status post cholecystectomy with intra and extrahepatic biliary duct dilatation, unchanged since May of 2013. The portal vein is normal. No suspicious liver lesions. Pancreas: Unremarkable. No pancreatic ductal dilatation or surrounding inflammatory changes. Spleen: Low-attenuation in the anterior spleen on series 2, image 30 is unchanged since 2013 of no significance. The spleen is otherwise normal. Adrenals/Urinary Tract: Right renal cyst. Adrenal glands and kidneys are otherwise normal. No hydronephrosis, ureterectasis, or ureteral stones. The bladder is unremarkable. Stomach/Bowel: The stomach and small bowel are normal. Postsurgical changes are associated with the sigmoid colon. Diverticulosis is seen without diverticulitis. Mild prominence of the transverse colon wall is thought to be due to poor distention with no adjacent stranding. No acute colonic abnormalities identified. The appendix is normal in appearance. Vascular/Lymphatic: No significant vascular findings are present. No enlarged abdominal or pelvic lymph nodes. Reproductive: Prostate is unremarkable. Other: Hernia mesh identified.  No other significant abnormalities. Musculoskeletal: Mild grade 1 retrolisthesis of L4 versus L5 is stable. No acute bony abnormalities. IMPRESSION: 1. No cause for the patient's acute pain identified. No  acute abnormalities. 2. Mild prominence of the transverse colon wall with no adjacent stranding is thought to be due to poor distention rather than underlying abnormality. Electronically Signed   By: Dorise Bullion III M.D   On: 10/03/2016 10:33    Procedures Procedures (including critical care time)  Medications Ordered in ED Medications  sodium chloride 0.9 % bolus 1,000 mL (0 mLs Intravenous Stopped 10/03/16 1219)  morphine 4 MG/ML injection 4 mg (4 mg Intravenous Given 10/03/16 0908)  iopamidol (ISOVUE-300) 61 % injection (100 mLs  Contrast Given 10/03/16 0933)     Initial Impression / Assessment and Plan / ED Course  I have reviewed the triage vital signs and the nursing notes.  Pertinent labs & imaging results that were available during my care of the patient were reviewed by me and considered in my medical decision making (see chart for details).  Clinical Course     Zachary [name] is a  55 y.o. male with a past medical history significant for prior pneumothorax, diverticulitis, constipation, and prior DVT who presents with abdominal pain. Patient feels this pain started yesterday after doing some lifting at work.  History and exam are seen above. Patient had significant tenderness all across his upper abdomen. Patient had no guarding or rebound tenderness. Patient had no unilateral or bilateral leg tenderness or swelling. Patient had clear breath sounds. Equal breath sounds bilaterally. Patient had no chest tenderness.  Given history of diverticulitis, patient will have laboratory testing and imaging testing to determine all etiology of abdominal pain. Patient does report that pain is similar to muscle strains and muscle pain he has had in the past but, given location, will evaluate for more severe etiology. Patient given pain medicine and Fluids during initial workup.   Urinalysis was Amber with no blood. History reveals that patient ate beets yesterday likely cause of color  change.   Lactic acid negative, troponin negative, Metabolic panel showed noSignificant electrolyte abnormalities no kidney dysfunction. AST slightly elevated. CBC showed no leukocytosis or anemia. Lipase negative.  Imaging show no acute cause of abdominal pain. Will specifically, no diverticulitis or problems with biliary system. No obstruction. Transverse colon shows some Prominence with no inflammation, felt to be non-pathologic. X-ray showed no pneumonia.  All given reassuring workup, patient felt stable for discharge. Given patients report that strong narcotics cause constipation which he has chronically struggled with, patient will be given tramadol and Flexeril to try and alleviate likely musculoskeletal abdominal pain. Patient given instructions to follow up with PCP as well as return if symptoms worsen. Patient agreed with plan of care and discharged in good condition.     Final Clinical Impressions(s) / ED Diagnoses   Final diagnoses:  Epigastric pain    New Prescriptions Discharge Medication List as of 10/03/2016  1:42 PM    START taking these medications   Details  cyclobenzaprine (FLEXERIL) 10 MG tablet Take 1 tablet (10 mg total) by mouth 2 (two) times daily as needed for muscle spasms., Starting Sun 10/03/2016, Print    traMADol (ULTRAM) 50 MG tablet Take 1 tablet (50 mg total) by mouth every 6 (six) hours as needed., Starting Sun 10/03/2016, Print       Clinical Impression: 1. Epigastric pain     Disposition: Discharge  Condition: Good  I have discussed the results, Dx and Tx plan with the pt(& family if present). He/she/they expressed understanding and agree(s) with the plan. Discharge instructions discussed at great length. Strict return precautions discussed and pt &/or family have verbalized understanding of the instructions. No further questions at time of discharge.    Discharge Medication List as of 10/03/2016  1:42 PM    START taking these medications    Details  cyclobenzaprine (FLEXERIL) 10 MG tablet Take 1 tablet (10 mg total) by mouth 2 (two) times daily as needed for muscle spasms., Starting Sun 10/03/2016, Print    traMADol (ULTRAM) 50 MG tablet Take 1 tablet (50 mg total) by mouth every 6 (six) hours as needed., Starting Sun 10/03/2016, Print        Follow Up: [facility] [address] [id number] [place] [phone]  If symptoms worsen  [facility] [address] [place] [phone] Schedule an appointment as soon as possible for a visit       Courtney Paris, MD 10/04/16 1121

## 2016-10-03 NOTE — ED Notes (Signed)
Patient transported to X-ray 

## 2016-10-03 NOTE — Discharge Instructions (Signed)
Please follow-up with your primary physician for further management of your abdominal pain. Please try the medicines to see if they help with her discomfort. If any symptoms return or worsen, please return to the nearest emergency department.

## 2016-10-06 ENCOUNTER — Ambulatory Visit (INDEPENDENT_AMBULATORY_CARE_PROVIDER_SITE_OTHER): Payer: Medicare Other | Admitting: Internal Medicine

## 2016-10-06 ENCOUNTER — Encounter: Payer: Self-pay | Admitting: Internal Medicine

## 2016-10-06 VITALS — BP 110/78 | HR 87 | Temp 97.4°F | Wt 203.0 lb

## 2016-10-06 DIAGNOSIS — T148XXA Other injury of unspecified body region, initial encounter: Secondary | ICD-10-CM

## 2016-10-06 MED ORDER — DICLOFENAC SODIUM 1 % TD GEL: 2.0000 g | Freq: Three times a day (TID) | TRANSDERMAL | 0 refills | Status: DC | PRN

## 2016-10-06 NOTE — Progress Notes (Signed)
   [facility] Phone: [phone]   Date of Visit: 10/06/2016   HPI:  Zachary [name] is a 55 y.o. male presenting to clinic today for same day appointment. PCP: Smiley Houseman, MD Concerns today include: Epigastric pain  - reports of Epigastric pain that started Saturday. 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  - reports of a sharp pain in that region when he takes a deep breath, when bending down, or twisting his trunk. "sometimes/a little bit" worsens after eating food. Staying still makes it better. No difficulty breathing. No URI symptoms. No diarrhea or constipation. Has daily BMs that are soft and without blood or dark/tarry. No LE swelling or calf pain.  - went to ED on Sunday for this epigastric pain. His labwork was unremarkable. CT abdomen was unremarkable as well. He was given Flexeril which he reports does not help. He was given Tramadol which did help.  - symptoms have not gotten worse, but has not improved very much since his ED visit. Yesterday he started feeling a little better he reports.  - no history of smoking - no history of NSAID use or PUD - denies history of heart burn - reports of sour taste in his mouth in the morning.    ROS: See HPI.  [place]:  Dyslipidemia History of Pneumothorax  Hx of DVT 2012 Diverticulosis  PHYSICAL EXAM: BP 110/78   Pulse 87   Temp 97.4 F (36.3 C) (Oral)   Wt 203 lb (92.1 kg)   SpO2 98%   BMI 29.98 kg/m  GEN: NAD CV: RRR, no murmurs, rubs, or gallops PULM: CTAB, normal effort ABD: Soft, tenderness in the epigastric region. Tenderness to palpation in the same area with flexion of abdominal muscles, normal bowel suonds, no organomegaly SKIN: No rash or cyanosis; warm and well-perfused EXTR: No lower extremity edema or calf tenderness PSYCH: Mood and affect euthymic, normal rate and volume of speech NEURO: Awake, alert, no focal deficits grossly,  normal speech  ASSESSMENT/PLAN:  1. Muscle strain History and exam are more consistent with muscle strain. Imaging done in the ED over the weekend was unremarkable. Will try Voltaren gel and heat to the area.  He reports it may slightly worsen with food so would also consider GER vs gastritis if Voltaren does not improve symptoms. Follow up if symptoms do not improve.    Smiley Houseman, MD PGY [place]

## 2016-10-06 NOTE — Patient Instructions (Addendum)
I think you may have strained a muscle in that area. Please try Voltaren gel. Apply to the affected area three times a day as needed. Use this three times a day only up to 7 days.  Please return to clinic if symptoms do not improve.

## 2016-10-08 ENCOUNTER — Ambulatory Visit (INDEPENDENT_AMBULATORY_CARE_PROVIDER_SITE_OTHER): Payer: Medicare Other | Admitting: Nurse Practitioner

## 2016-10-08 ENCOUNTER — Encounter: Payer: Self-pay | Admitting: Nurse Practitioner

## 2016-10-08 VITALS — BP 128/80 | HR 74 | Ht 69.0 in | Wt 207.0 lb

## 2016-10-08 DIAGNOSIS — M791 Myalgia: Secondary | ICD-10-CM | POA: Diagnosis not present

## 2016-10-08 DIAGNOSIS — R1013 Epigastric pain: Secondary | ICD-10-CM | POA: Diagnosis not present

## 2016-10-08 DIAGNOSIS — M7918 Myalgia, other site: Secondary | ICD-10-CM

## 2016-10-08 MED ORDER — METHOCARBAMOL 500 MG PO TABS: 500.0000 mg | ORAL_TABLET | Freq: Two times a day (BID) | ORAL | 0 refills | Status: DC

## 2016-10-08 NOTE — Progress Notes (Signed)
     HPI: Patient is a 55 year old male known to Dr. Havery Moros. His GI history includes adenomatous colon polyps, diverticulosis, and chronic rectal pressure. Patient was seen in the emergency department 10/03/16 with upper abdominal pain which was diagnosed as musculoskeletal in nature. Patient had been lifting some heavy materials at work the day prior to onset of pain. He was given Flexeril and tramadol to try. Chem profile, lipase and CBC were unremarkable. CT scan of the abdomen and pelvis with contrast was unrevealing. The pain is diffuse across the upper abdomen. It does not radiate, it is unrelated to eating. No associated nausea.   Past Medical History:  Diagnosis Date  . Chronic back pain   . Depression   . Diverticulosis 2006  . History of depression   . History of pneumothorax   . Lipoma   . Rectal pain   . Right leg DVT august 2012    Patient's surgical history, family medical history, social history, medications and allergies were all reviewed in Epic    Physical Exam: BP 128/80   Pulse 74   Ht 5\' 9"  (1.753 m)   Wt 207 lb (93.9 kg)   BMI 30.57 kg/m   GENERAL: Well developed white male in NAD PSYCH: :Pleasant, cooperative, normal affect HEENT: Normocephalic, conjunctiva pink, mucous membranes moist, neck supple without masses CARDIAC:  RRR, no murmur heard, no peripheral edema PULM: Normal respiratory effort, lungs CTA bilaterally, no wheezing ABDOMEN:  soft, nontender, nondistended, no obvious masses, no hepatomegaly,  normal bowel sounds SKIN:  turgor, no lesions seen Musculoskeletal:  Normal muscle tone and normal strength NEURO: Alert and oriented x 3, no focal neurologic deficits   ASSESSMENT and PLAN:  55 year old male with diffuse, nonradiating upper abdominal pain. Agree with ED evaluation. This does seem to be of musculoskeletal origin. - Flexeril given in the ED makes patient very sleepy. Will change to Robaxin and try this for a few days. -heating  bad prn -If no improvement in 3-4 days patient will try Prilosec 20mg  Q am.  -Patient will call us in 7-10 days with a condition update.     Tye Savoy , NP 10/08/2016, 9:19 AM

## 2016-10-08 NOTE — Patient Instructions (Signed)
  Use a heating pad as needed for your pain.   Try over the counter Prilosec 20mg  ,( take one every morning 30 minutes before breakfast) if muscle relaxer no help after 3-4 days. Samples provided.    Stop your flexeril.   We have sent the following medications to your pharmacy for you to pick up at your convenience: Robaxin   Call us in 10 days with an update on your condition.     I appreciate the opportunity to care for you.

## 2016-10-11 NOTE — Progress Notes (Signed)
Agree with assessment and plan as outlined.  

## 2017-02-01 ENCOUNTER — Ambulatory Visit (INDEPENDENT_AMBULATORY_CARE_PROVIDER_SITE_OTHER): Payer: Medicare Other | Admitting: Internal Medicine

## 2017-02-01 ENCOUNTER — Encounter: Payer: Self-pay | Admitting: Internal Medicine

## 2017-02-01 VITALS — BP 111/80 | HR 77 | Temp 97.7°F | Ht 73.0 in | Wt 222.4 lb

## 2017-02-01 DIAGNOSIS — R1031 Right lower quadrant pain: Secondary | ICD-10-CM

## 2017-02-01 NOTE — Patient Instructions (Signed)
Thank you fot coming. You can try advil or ibuprofen and or tylenol as needed. Please let me know if your symptoms persist so we can refer to surgery.

## 2017-02-01 NOTE — Progress Notes (Signed)
   [facility] Phone: [phone]   Date of Visit: 02/01/2017   HPI:  Patient presents today for right groin pain:  - reports of constant discomfort in the right inguinal area - started about 20 days ago - symptoms are worse when he lifts heavy objects. Describes as a sharp pain Pain does not radiate. No n/v/d, fever or chills or abdominal pain. No dysuria or increased frequency. Symptoms have been stable and not worsening - has a history of bilateral inguinal hernias repaired in 2002 and repeat repair on the right side in 2003 with mesh.  - has not tried any medications for symptoms  ROS: See HPI.  [place]:  BPH Hx recurrent UTI Chronic Prostatitis  Lipomas Chronic Back pain   PHYSICAL EXAM: BP 111/80 (BP Location: Right Arm, Patient Position: Sitting, Cuff Size: Normal)   Pulse 77   Temp 97.7 F (36.5 C) (Oral)   Ht 6\' 1"  (1.854 m)   Wt 222 lb 6.4 oz (100.9 kg)   SpO2 94%   BMI 29.34 kg/m  GEN: NAD CV: RRR, no murmurs, rubs, or gallops PULM: CTAB, normal effort ABD: Soft, nontender, nondistended, NABS, no organomegaly GU: Well healed surgical scar to right groin from previous hernia repair.  No erythema or edema noted. Area is not warm to touch. repots of mild discomfort with palpation of the right inguinal region.  No deformity or protrusions noted with bilateral inguinal hernia exam SKIN: No rash or cyanosis; warm and well-perfused EXTR: No lower extremity edema or calf tenderness PSYCH: Mood and affect euthymic, normal rate and volume of speech NEURO: Awake, alert, no focal deficits grossly, normal speech   ASSESSMENT/PLAN: Right groin pain No sign of recurrent hernia on exam. Possibly MSK in nature. If symptoms persist, consider right inguinal ultrasound to further evaluate.  - recommended OTC NSAID and/or Tylenol - follow up in 1-2 weeks or sooner if symptoms worsen.    Smiley Houseman, MD PGY [place]

## 2017-05-24 ENCOUNTER — Encounter: Payer: Self-pay | Admitting: Pulmonary Disease

## 2017-05-24 ENCOUNTER — Ambulatory Visit (INDEPENDENT_AMBULATORY_CARE_PROVIDER_SITE_OTHER)
Admission: RE | Admit: 2017-05-24 | Discharge: 2017-05-24 | Disposition: A | Discharge status: Home / Self Care | Payer: Medicare Other | Source: Ambulatory Visit | Attending: Pulmonary Disease | Admitting: Pulmonary Disease

## 2017-05-24 ENCOUNTER — Ambulatory Visit (INDEPENDENT_AMBULATORY_CARE_PROVIDER_SITE_OTHER): Payer: Medicare Other | Admitting: Pulmonary Disease

## 2017-05-24 VITALS — BP 124/70 | HR 69 | Ht 73.0 in | Wt 213.6 lb

## 2017-05-24 DIAGNOSIS — R05 Cough: Secondary | ICD-10-CM

## 2017-05-24 DIAGNOSIS — R059 Cough, unspecified: Secondary | ICD-10-CM

## 2017-05-24 LAB — NITRIC OXIDE: Nitric Oxide: 18

## 2017-05-24 MED ORDER — AZELASTINE-FLUTICASONE 137-50 MCG/ACT NA SUSP: 1.0000 | Freq: Two times a day (BID) | NASAL | 0 refills | Status: DC

## 2017-05-24 MED ORDER — CHLORPHENIRAMINE MALEATE 4 MG PO TABS: 8.0000 mg | ORAL_TABLET | Freq: Three times a day (TID) | ORAL | 1 refills | Status: DC

## 2017-05-24 NOTE — Patient Instructions (Addendum)
We will schedule for a chest x-ray We'll start you on chlorphentermine 8 mg 3 times daily and give samples of dymista, Use the dymista instead of the flonase  Return in 3-6 months

## 2017-05-24 NOTE — Progress Notes (Signed)
Zachary [name]    [id number]    10/23/1961  Primary Care Physician:Gunadasa, Almond Lint, MD  Referring Physician: Smiley Houseman, MD [address] [place], [place] [address]  Chief complaint:  Consult for evaluation of cough  HPI: 56 year old with past medical history of pneumothorax, DVT [no PE], chronic sinusitis, allergy, upper airway cough. He was initially evaluated in the pulmonary clinic in 2015 by Dr. Gwenette Greet for chronic cough, upper airway cough syndrome. He was also seen by ENT with a CT of the sinuses which showed nasal fracture, chronic sinusitis. He was treated at that time with antihistamines and Flonase nasal spray with improvement in symptoms. He returns today with 5 months of similar complaints. He has cough associated with yellow mucus, postnasal drip, irritation at the back of the throat. He denies any sputum production, wheezing, dyspnea, or GERD symptoms. When he coughs he reports pain under his right ribs. This is nonradiating in nature with no palpitations.  He has a history of pneumothorax on left in 1983 due to trauma in his native [place]. It was treated at that time with a chest tube. He also has history of rt lower extremity DVT in 2014 treated with anticoagulation. He is no longer on anticoagulation at this point  Outpatient Encounter Prescriptions as of 05/24/2017  Medication Sig  . acetaminophen (TYLENOL) 500 MG tablet Take 500 mg by mouth every 6 (six) hours as needed.  . fluticasone (FLONASE) 50 MCG/ACT nasal spray Place 1 spray into both nostrils daily.  . [DISCONTINUED] AMBULATORY NON FORMULARY MEDICATION Nitroglycerine Ointment 0.125 % . Use a pea sized amount every 8 hours.  . [DISCONTINUED] fluocinonide ointment (LIDEX) 4.19 % Apply 1 application topically 2 (two) times daily.  . [DISCONTINUED] HYDROcodone-acetaminophen (NORCO/VICODIN) 5-325 MG tablet Take 1-2 tablets by mouth every 6 hours as needed for pain.  . [DISCONTINUED]  hydrocortisone (PROCTOCARE-HC) 2.5 % rectal cream Place 1 application rectally at bedtime.  . [DISCONTINUED] methocarbamol (ROBAXIN) 500 MG tablet Take 1 tablet (500 mg total) by mouth 2 (two) times daily.  . [DISCONTINUED] traMADol (ULTRAM) 50 MG tablet Take 1 tablet (50 mg total) by mouth every 6 (six) hours as needed.   No facility-administered encounter medications on file as of 05/24/2017.     Allergies as of 05/24/2017  . (No Known Allergies)    Past Medical History:  Diagnosis Date  . Chronic back pain   . Depression   . Diverticulosis 2006  . History of depression   . History of pneumothorax   . Lipoma   . Rectal pain   . Right leg DVT august 2012    Past Surgical History:  Procedure Laterality Date  . CHEST TUBE INSERTION     for pneumothorax  . CHOLECYSTECTOMY  2005  . CYSTECTOMY  2010, 2011  . INGUINAL HERNIA REPAIR  2002, 2003   x2  . LIPOMA EXCISION  10/28/2011   Procedure: MINOR EXCISION LIPOMA;  Surgeon: Rolm Bookbinder, MD;  Location: [facility];  Service: General;  Laterality: N/A;  2 back lipoma excison  1 x 2 cm subcutaneous, 3 x4 cm subcutaneous  . SMALL INTESTINE SURGERY    . Spinal Steroid injections      Family History  Problem Relation Age of Onset  . Stroke Father   . Heart attack Mother   . Heart disease Mother        heart attack  . Colon cancer Neg Hx   .  Stomach cancer Neg Hx     Social History   Social History  . Marital status: Married    Spouse name: N/A  . Number of children: 3  . Years of education: N/A   Occupational History  . Surveyor, [employer]    Social History Main Topics  . Smoking status: Never Smoker  . Smokeless tobacco: Never Used  . Alcohol use No  . Drug use: No  . Sexual activity: Not on file   Other Topics Concern  . Not on file   Social History Narrative   Married with 3 children ages 66, 53 and 109.  Originally from former [place].  On disability 2/2 to his chronic back pain. Denies  tobacco use.  Drinks 2-3 glasses of wine per day.  Denies illicit drugs.    Review of systems: Review of Systems  Constitutional: Negative for fever and chills.  HENT: Negative.   Eyes: Negative for blurred vision.  Respiratory: as per HPI  Cardiovascular: Negative for chest pain and palpitations.  Gastrointestinal: Negative for vomiting, diarrhea, blood per rectum. Genitourinary: Negative for dysuria, urgency, frequency and hematuria.  Musculoskeletal: Negative for myalgias, back pain and joint pain.  Skin: Negative for itching and rash.  Neurological: Negative for dizziness, tremors, focal weakness, seizures and loss of consciousness.  Endo/Heme/Allergies: Negative for environmental allergies.  Psychiatric/Behavioral: Negative for depression, suicidal ideas and hallucinations.  All other systems reviewed and are negative.  Physical Exam: Blood pressure 124/70, pulse 69, height 6\' 1"  (1.854 m), weight 213 lb 9.6 oz (96.9 kg), SpO2 97 %. Gen:      No acute distress HEENT:  EOMI, sclera anicteric, red inflamed posterior pharynx Neck:     No masses; no thyromegaly Lungs:    Clear to auscultation bilaterally; normal respiratory effort CV:         Regular rate and rhythm; no murmurs Abd:      + bowel sounds; soft, non-tender; no palpable masses, no distension Ext:    No edema; adequate peripheral perfusion Skin:      Warm and dry; no rash Neuro: alert and oriented x 3 Psych: normal mood and affect  Data Reviewed: FENO 05/24/17- 18  Assessment:  Evaluation for cough This is likely recurrence of his upper airway cough syndrome from postnasal drip, sinusitis. We will treat this with antihistamine chlorpheniramine 8 mg 3 times daily and dymista nasal spray. He will use the dymista instead of the flonase until improvement in symptoms  Given his history of pneumothorax I'll get a chest x-ray for further evaluation.  Plan/Recommendations: - Chest x-ray - Chlorphentermine 8 mg 3 times  daily, dymista nasal spray  Marshell Garfinkel MD [facility] Pager [phone] 05/24/2017, 11:10 AM  CC: Smiley Houseman, MD

## 2017-09-07 NOTE — Progress Notes (Deleted)
   [facility] Phone: [phone]   Date of Visit: 09/08/2017   HPI:  ***  ROS: See HPI.  [place]:  PMH: BPH Hx recurrent UTI Chronic Prostatis HLD Multiple Lipomas History of Colonic Polyps Chronic Back Pain  PHYSICAL EXAM: There were no vitals taken for this visit. Gen: *** HEENT: *** Heart: *** Lungs: *** Neuro: *** Ext: ***  ASSESSMENT/PLAN:  Health maintenance:  -***  No problem-specific Assessment & Plan notes found for this encounter.  FOLLOW UP: Follow up in *** for ***  Smiley Houseman, MD PGY [place]

## 2017-09-08 ENCOUNTER — Ambulatory Visit: Payer: Medicare Other | Admitting: Internal Medicine

## 2017-09-08 ENCOUNTER — Ambulatory Visit (INDEPENDENT_AMBULATORY_CARE_PROVIDER_SITE_OTHER): Payer: Medicare Other | Admitting: Internal Medicine

## 2017-09-08 ENCOUNTER — Encounter: Payer: Self-pay | Admitting: Internal Medicine

## 2017-09-08 VITALS — BP 130/80 | HR 97 | Temp 98.4°F | Ht 69.0 in

## 2017-09-08 DIAGNOSIS — M5441 Lumbago with sciatica, right side: Secondary | ICD-10-CM | POA: Diagnosis not present

## 2017-09-08 DIAGNOSIS — R809 Proteinuria, unspecified: Secondary | ICD-10-CM

## 2017-09-08 DIAGNOSIS — G8929 Other chronic pain: Secondary | ICD-10-CM | POA: Diagnosis not present

## 2017-09-08 LAB — POCT URINALYSIS DIP (MANUAL ENTRY)
BILIRUBIN UA: NEGATIVE
BILIRUBIN UA: NEGATIVE mg/dL
Glucose, UA: NEGATIVE mg/dL
Leukocytes, UA: NEGATIVE
Nitrite, UA: NEGATIVE
PH UA: 5.5 (ref 5.0–8.0)
Protein Ur, POC: NEGATIVE mg/dL
Spec Grav, UA: >=1.03 — AB (ref 1.010–1.025)
Urobilinogen, UA: 0.2 E.U./dL

## 2017-09-08 LAB — POCT UA - MICROSCOPIC ONLY

## 2017-09-08 MED ORDER — CYCLOBENZAPRINE HCL 5 MG PO TABS: 5.0000 mg | ORAL_TABLET | Freq: Three times a day (TID) | ORAL | 0 refills | Status: DC | PRN

## 2017-09-08 NOTE — Progress Notes (Signed)
   [facility] Phone: [phone]  Subjective:  Zachary [name] is a 56 year old male presenting to clinic with proteinuria and chronic low back pain.  Proteinuria: He was seen at urgent care on 09/02/17 with frequent urination. He was thought to have prostatitis and was prescribed Ciprofloxacin. He has been taking this without any issues. He was noted to have protein and blood in his urine on UA. Per patient, he was advised to follow-up with his PCP for further management. He would like for his kidneys to be checked today. His urinary frequency has improved. No dysuria.  Chronic Low Back Pain: Has been going on for 11 years. The pain is usually just located across his lower back, but over the last 5 days, he has had pain shooting down the back and outside of his leg, down to his toes. This has happened a few times in the past. No recent trauma or injury to the back. The pain is worse with bending forward. He follows with a spine surgeon in [place] (Dr. Youlanda Mighty Coric). He recently called Dr. Olen Pel office and was told by someone that he should have an MRI of his spine done. He would like to be prescribed Flexeril today, because he has been on this in the past and it has helped his symptoms. No bowel/bladder incontinence, no saddle anesthesia.  ROS: See HPI for pertinent positives and negatives  Past Medical History- BPH, chronic prostatitis, chronic low back pain, HLD  Family history reviewed for today's visit. No changes.  Social history- patient is a never smoker  Objective: BP 130/80   Pulse 97   Temp 98.4 F (36.9 C) (Oral)   Ht 5\' 9"  (1.753 m)   BMI 31.54 kg/m  Gen: NAD, alert, cooperative with exam Back: No midline tenderness, +tenderness to palpation over the lumbar paraspinal muscles, decreased ROM in all directions Neuro: Straight leg raise negative bilaterally, 5/5 strength in the lower extremities bilaterally, normal  gait.  Assessment/Plan: Proteinuria: Recent UA performed at urgent care in [place] with small bilirubin, trace ketones, large blood, 30 protein. Diagnosed with acute prostatitis and was treated with Ciprofloxacin. No history of diabetes or HTN. Not on any nephrotoxic medications. Baseline creatinine on chart review appears to be 0.9-1.1. - Repeat UA today - Check BMP to assess kidney function - Will call patient to discuss results.  Chronic Low Back Pain: Follows with spine surgeon in [place]. Having new right sided radiculopathy down his leg, which has happened a few times in the past. No previous MRI of the lumbar spine that I can see on chart review. No red flags. Think this is likely degenerative joint disease in the back. May have a component of spinal stenosis or facet joint arthropathy that is putting pressure on the nerves. - Prescribed Flexeril tid prn, as patient states this has helped in the past - Advised patient to follow-up with his neurosurgeon for further management. Will let them order an MRI if they feel that this is appropriate.   Hyman Bible, MD PGY-3

## 2017-09-08 NOTE — Patient Instructions (Signed)
It was so nice to meet you!  We have ordered labs for your kidneys. I will call you with these results.  I have ordered Flexeril for your back.  -Dr. Brett Albino

## 2017-09-09 DIAGNOSIS — M545 Low back pain, unspecified: Secondary | ICD-10-CM | POA: Insufficient documentation

## 2017-09-09 DIAGNOSIS — G8929 Other chronic pain: Secondary | ICD-10-CM | POA: Insufficient documentation

## 2017-09-09 DIAGNOSIS — R809 Proteinuria, unspecified: Secondary | ICD-10-CM | POA: Insufficient documentation

## 2017-09-09 LAB — BASIC METABOLIC PANEL
BUN/Creatinine Ratio: 13 (ref 9–20)
BUN: 19 mg/dL (ref 6–24)
CALCIUM: 9 mg/dL (ref 8.7–10.2)
CO2: 23 mmol/L (ref 20–29)
CREATININE: 1.44 mg/dL — AB (ref 0.76–1.27)
Chloride: 101 mmol/L (ref 96–106)
GFR calc Af Amer: 62 mL/min/{1.73_m2} (ref >59)
GFR, EST NON AFRICAN AMERICAN: 54 mL/min/{1.73_m2} — AB (ref >59)
GLUCOSE: 97 mg/dL (ref 65–99)
Potassium: 4.7 mmol/L (ref 3.5–5.2)
SODIUM: 140 mmol/L (ref 134–144)

## 2017-09-09 NOTE — Assessment & Plan Note (Signed)
Recent UA performed at urgent care in [place] with small bilirubin, trace ketones, large blood, 30 protein. Diagnosed with acute prostatitis and was treated with Ciprofloxacin. No history of diabetes or HTN. Not on any nephrotoxic medications. Baseline creatinine on chart review appears to be 0.9-1.1. - Repeat UA today - Check BMP to assess kidney function - Will call patient to discuss results.

## 2017-09-09 NOTE — Assessment & Plan Note (Signed)
Follows with Licensed conveyancer in [place]. Having new right sided radiculopathy down his leg, which has happened a few times in the past. No previous MRI of the lumbar spine that I can see on chart review. No red flags. Think this is likely degenerative joint disease in the back. May have a component of spinal stenosis or facet joint arthropathy that is putting pressure on the nerves. - Prescribed Flexeril tid prn, as patient states this has helped in the past - Advised patient to follow-up with his neurosurgeon for further management. Will let them order an MRI if they feel that this is appropriate.

## 2017-11-08 IMAGING — CR DG CHEST 2V
2 series · 2 of 2 positions shown · non-contrast
Comparison: 03/14/2015

CLINICAL DATA: Chest pain, epigastric pain, history diverticulitis

EXAM:
CHEST  2 VIEW

[chest pa]
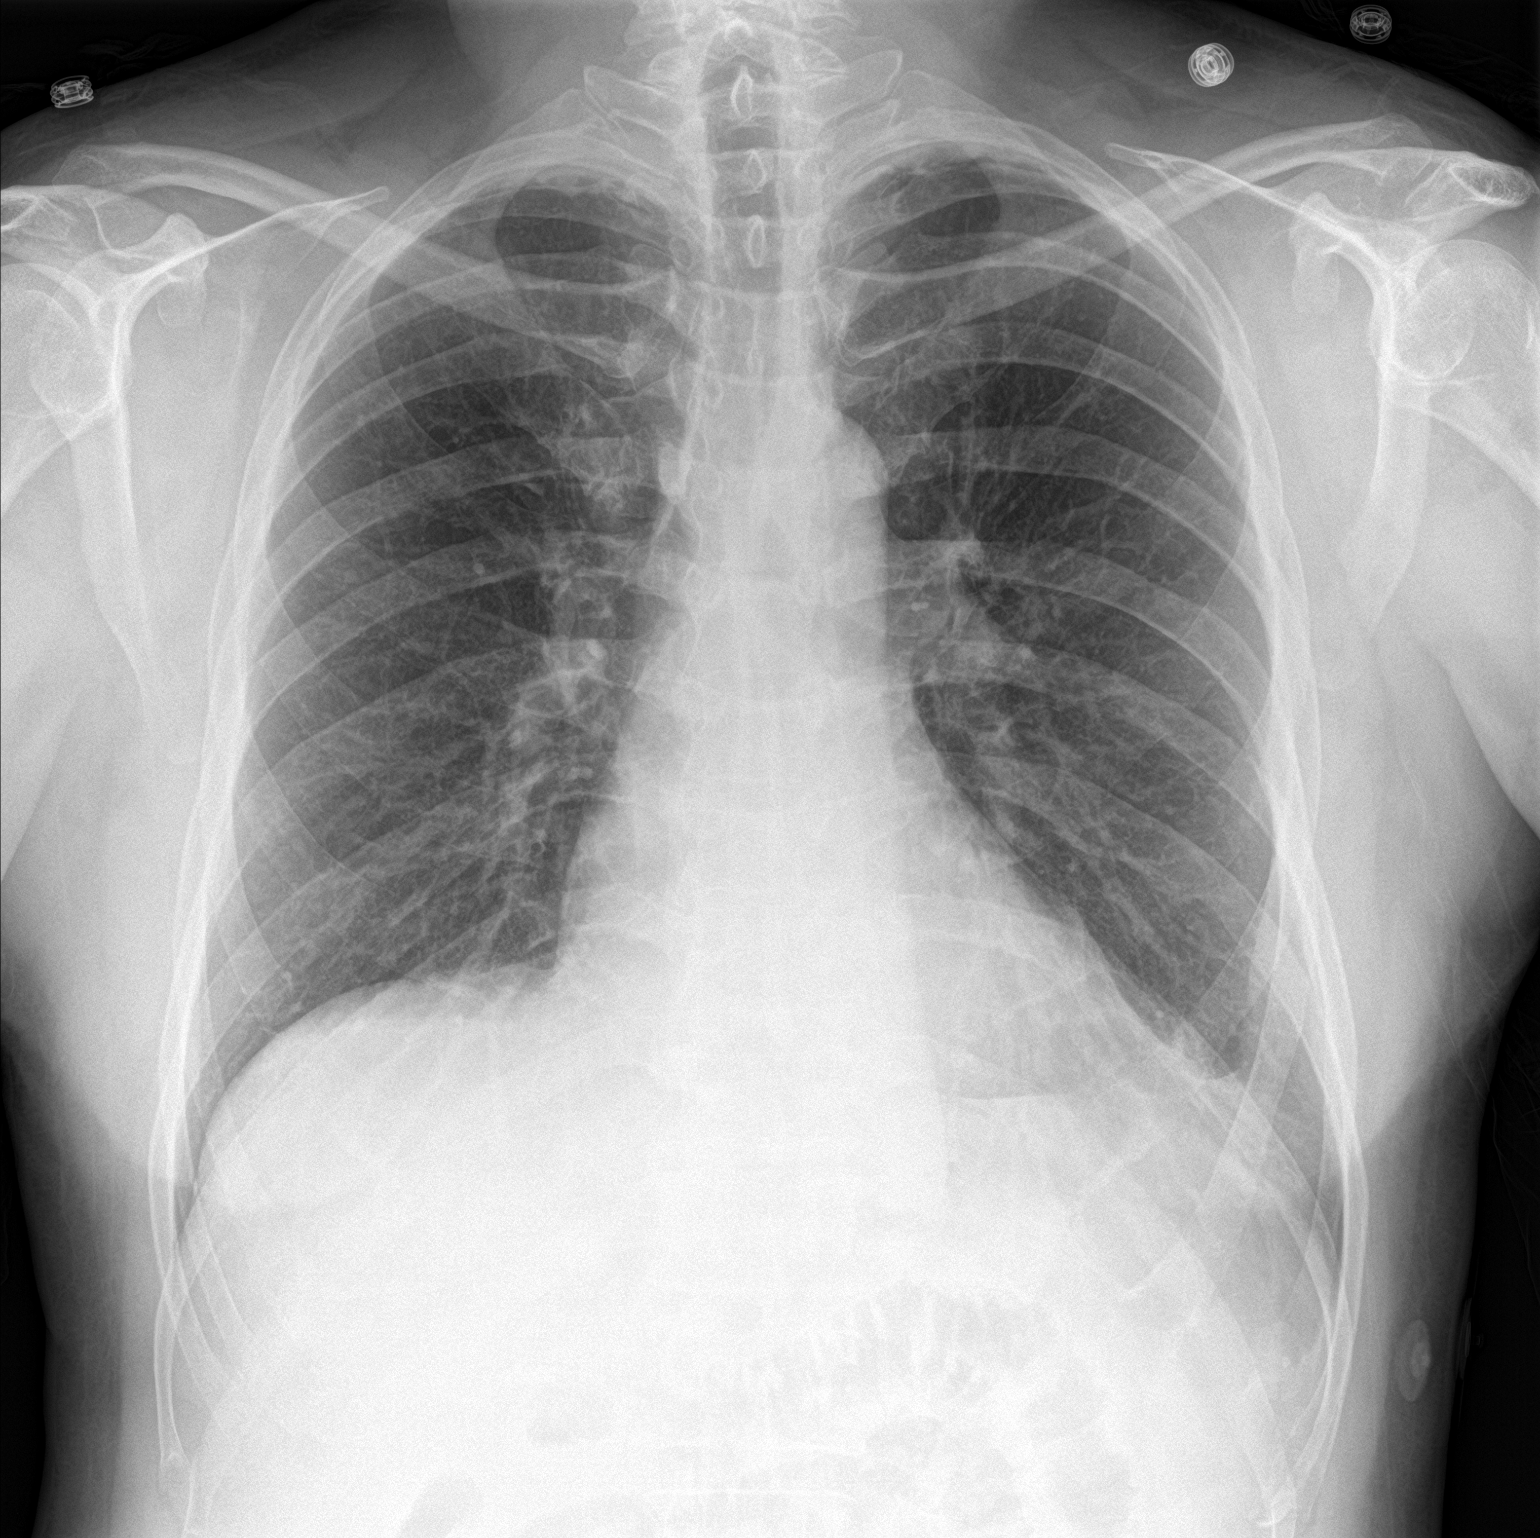

[chest lat]
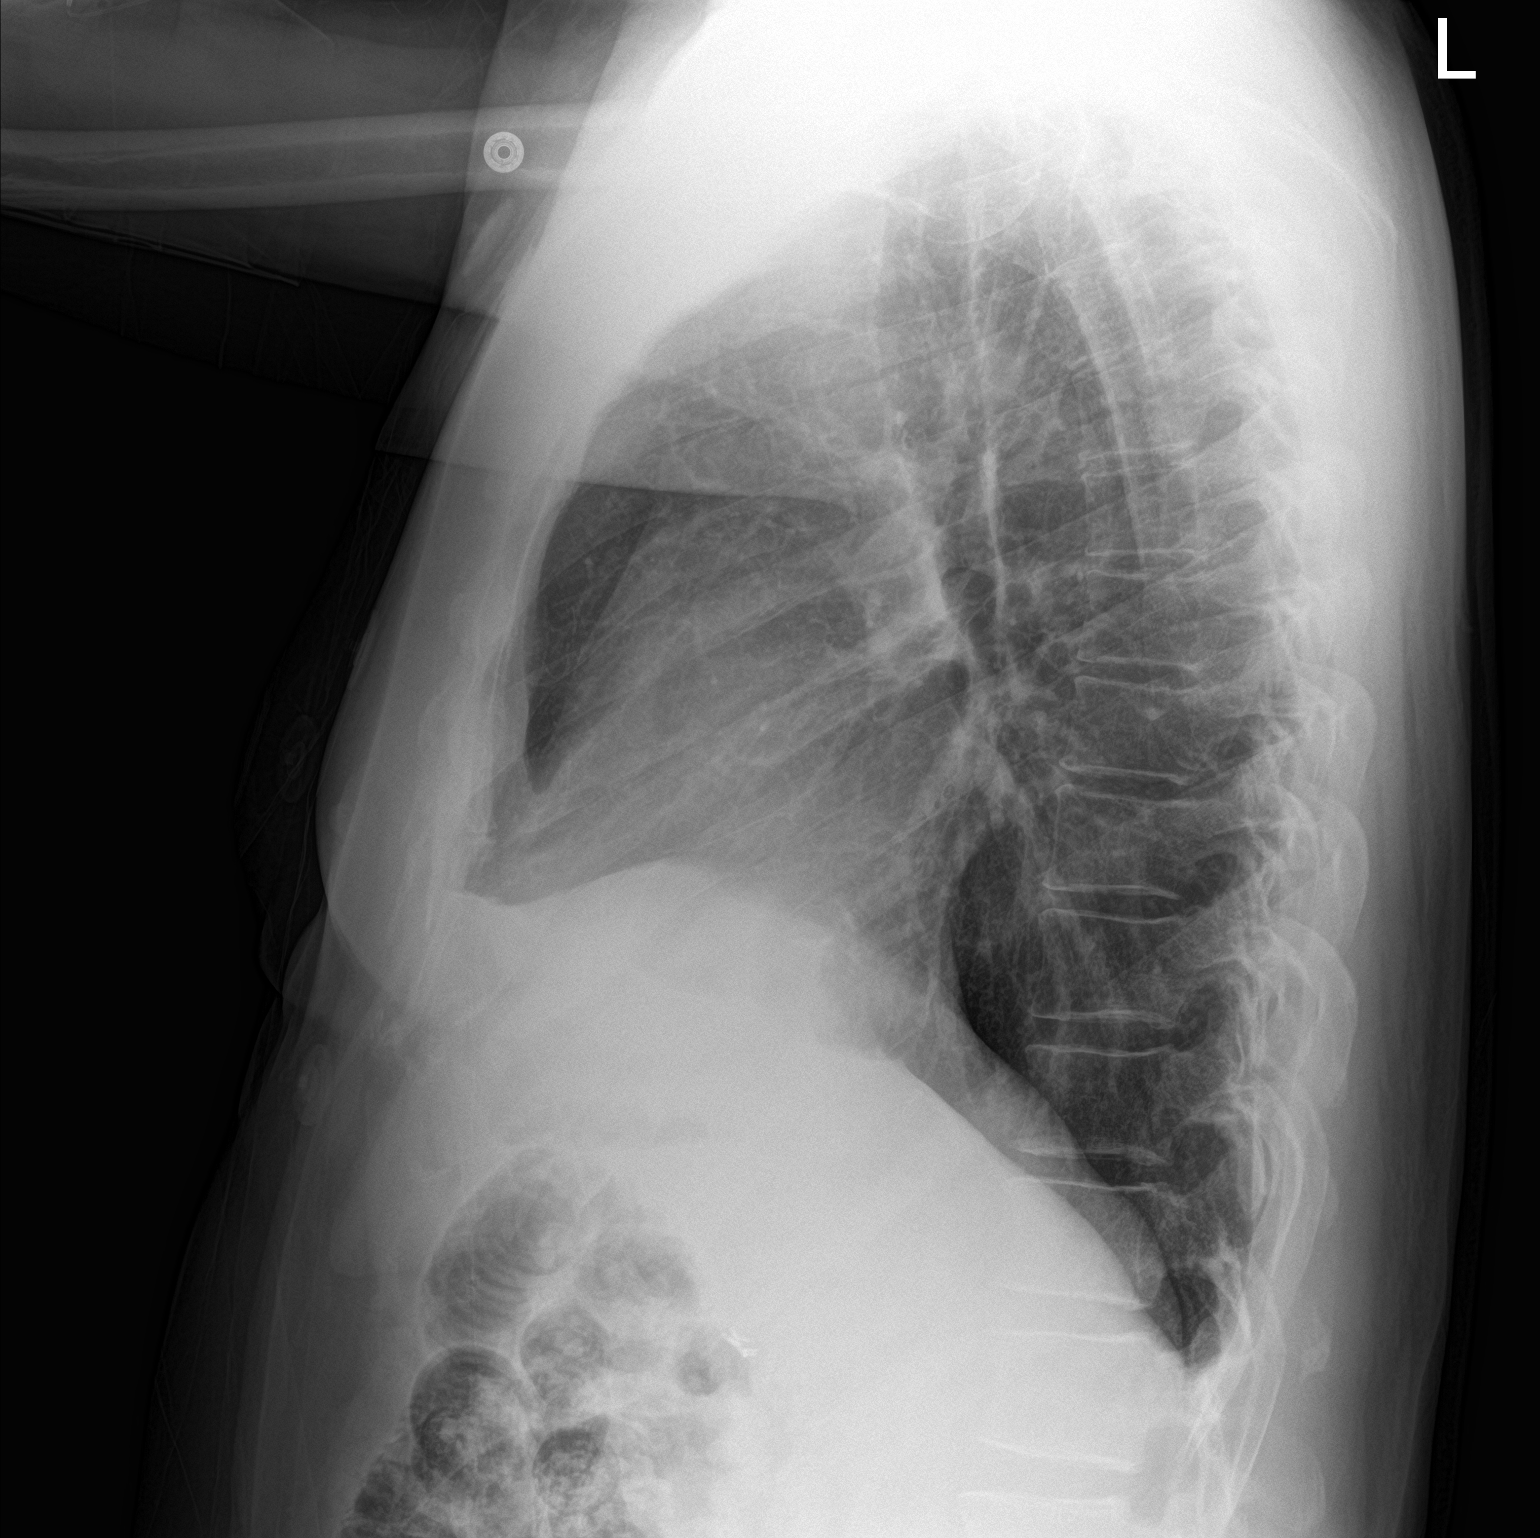

[2 of 2 positions shown; findings below may reference images not displayed]

FINDINGS: Normal heart size, mediastinal contours, and pulmonary vascularity.

Lungs clear.

No pleural effusion or pneumothorax.

Bones unremarkable.
IMPRESSION: Normal exam.

## 2017-11-28 ENCOUNTER — Encounter: Payer: Self-pay | Admitting: Gastroenterology

## 2018-02-08 ENCOUNTER — Encounter: Payer: Self-pay | Admitting: Gastroenterology

## 2018-03-02 ENCOUNTER — Ambulatory Visit (AMBULATORY_SURGERY_CENTER): Payer: Self-pay | Admitting: *Deleted

## 2018-03-02 ENCOUNTER — Other Ambulatory Visit: Payer: Self-pay

## 2018-03-02 VITALS — Ht 69.0 in | Wt 209.0 lb

## 2018-03-02 DIAGNOSIS — Z86010 Personal history of colonic polyps: Secondary | ICD-10-CM

## 2018-03-02 MED ORDER — NA SULFATE-K SULFATE-MG SULF 17.5-3.13-1.6 GM/177ML PO SOLN: ORAL | 0 refills | Status: DC

## 2018-03-02 NOTE — Progress Notes (Signed)
Patient denies any allergies to eggs or soy. Patient denies any problems with anesthesia/sedation. Patient denies any oxygen use at home. Patient denies taking any diet/weight loss medications or blood thinners. EMMI education declined by the pt.  

## 2018-03-13 ENCOUNTER — Ambulatory Visit (AMBULATORY_SURGERY_CENTER): Payer: Medicare Other | Admitting: Gastroenterology

## 2018-03-13 ENCOUNTER — Other Ambulatory Visit: Payer: Self-pay

## 2018-03-13 ENCOUNTER — Encounter: Payer: Self-pay | Admitting: Gastroenterology

## 2018-03-13 VITALS — BP 101/72 | HR 56 | Temp 98.9°F | Resp 14 | Ht 69.0 in | Wt 209.0 lb

## 2018-03-13 DIAGNOSIS — D125 Benign neoplasm of sigmoid colon: Secondary | ICD-10-CM

## 2018-03-13 DIAGNOSIS — Z86010 Personal history of colonic polyps: Secondary | ICD-10-CM | POA: Diagnosis not present

## 2018-03-13 DIAGNOSIS — D124 Benign neoplasm of descending colon: Secondary | ICD-10-CM

## 2018-03-13 MED ORDER — SODIUM CHLORIDE 0.9 % IV SOLN
500.0000 mL | Freq: Once | INTRAVENOUS | Status: DC
Start: 2018-03-13 — End: 2018-06-21

## 2018-03-13 NOTE — Patient Instructions (Signed)
HANDOUTS GIVEN FOR POLYPS, DIVERTICULOSIS AND HEMORRHOIDS  YOU HAD AN ENDOSCOPIC PROCEDURE TODAY AT THE [facility]:   Refer to the procedure report that was given to you for any specific questions about what was found during the examination.  If the procedure report does not answer your questions, please call your gastroenterologist to clarify.  If you requested that your care partner not be given the details of your procedure findings, then the procedure report has been included in a sealed envelope for you to review at your convenience later.  YOU SHOULD EXPECT: Some feelings of bloating in the abdomen. Passage of more gas than usual.  Walking can help get rid of the air that was put into your GI tract during the procedure and reduce the bloating. If you had a lower endoscopy (such as a colonoscopy or flexible sigmoidoscopy) you may notice spotting of blood in your stool or on the toilet paper. If you underwent a bowel prep for your procedure, you may not have a normal bowel movement for a few days.  Please Note:  You might notice some irritation and congestion in your nose or some drainage.  This is from the oxygen used during your procedure.  There is no need for concern and it should clear up in a day or so.  SYMPTOMS TO REPORT IMMEDIATELY:   Following lower endoscopy (colonoscopy or flexible sigmoidoscopy):  Excessive amounts of blood in the stool  Significant tenderness or worsening of abdominal pains  Swelling of the abdomen that is new, acute  Fever of 100F or higher  For urgent or emergent issues, a gastroenterologist can be reached at any hour by calling [phone].   DIET:  We do recommend a small meal at first, but then you may proceed to your regular diet.  Drink plenty of fluids but you should avoid alcoholic beverages for 24 hours.  ACTIVITY:  You should plan to take it easy for the rest of today and you should NOT DRIVE or use heavy machinery until tomorrow  (because of the sedation medicines used during the test).    FOLLOW UP: Our staff will call the number listed on your records the next business day following your procedure to check on you and address any questions or concerns that you may have regarding the information given to you following your procedure. If we do not reach you, we will leave a message.  However, if you are feeling well and you are not experiencing any problems, there is no need to return our call.  We will assume that you have returned to your regular daily activities without incident.  If any biopsies were taken you will be contacted by phone or by letter within the next 1-3 weeks.  Please call us at [phone] if you have not heard about the biopsies in 3 weeks.    SIGNATURES/CONFIDENTIALITY: You and/or your care partner have signed paperwork which will be entered into your electronic medical record.  These signatures attest to the fact that that the information above on your After Visit Summary has been reviewed and is understood.  Full responsibility of the confidentiality of this discharge information lies with you and/or your care-partner. 

## 2018-03-13 NOTE — Progress Notes (Signed)
No changes in medical or surgical hx since PV 

## 2018-03-13 NOTE — Progress Notes (Signed)
Called to room to assist during endoscopic procedure.  Patient ID and intended procedure confirmed with present staff. Received instructions for my participation in the procedure from the performing physician.  

## 2018-03-13 NOTE — Progress Notes (Signed)
To PACU, VSS. Report to [email or web address] 

## 2018-03-13 NOTE — Op Note (Signed)
[facility] Patient Name: Zachary [name] Procedure Date: 03/13/2018 3:38 PM MRN: [id number] Endoscopist: Remo Lipps P. Havery Moros , MD Age: 57 Referring MD:  Date of Birth: October 30, 1960 Gender: Male Account #: [id number] Procedure:                Colonoscopy Indications:              Screening for colorectal malignant neoplasm Medicines:                Monitored Anesthesia Care Procedure:                Pre-Anesthesia Assessment:                           - Prior to the procedure, a History and Physical                            was performed, and patient medications and                            allergies were reviewed. The patient's tolerance of                            previous anesthesia was also reviewed. The risks                            and benefits of the procedure and the sedation                            options and risks were discussed with the patient.                            All questions were answered, and informed consent                            was obtained. Prior Anticoagulants: The patient has                            taken no previous anticoagulant or antiplatelet                            agents. ASA Grade Assessment: II - A patient with                            mild systemic disease. After reviewing the risks                            and benefits, the patient was deemed in                            satisfactory condition to undergo the procedure.                           After obtaining informed consent, the colonoscope  was passed under direct vision. Throughout the                            procedure, the patient's blood pressure, pulse, and                            oxygen saturations were monitored continuously. The                            Colonoscope was introduced through the anus and                            advanced to the the cecum, identified by                            appendiceal orifice  and ileocecal valve. The                            colonoscopy was performed without difficulty. The                            patient tolerated the procedure well. The quality                            of the bowel preparation was good. The ileocecal                            valve, appendiceal orifice, and rectum were                            photographed. Scope In: 3:39:28 PM Scope Out: 3:58:35 PM Scope Withdrawal Time: 0 hours 14 minutes 33 seconds  Total Procedure Duration: 0 hours 19 minutes 7 seconds  Findings:                 The perianal and digital rectal examinations were                            normal.                           Two sessile polyps were found in the sigmoid colon.                            The polyps were 3 to 4 mm in size. These polyps                            were removed with a cold snare. Resection and                            retrieval were complete.                           A 3 mm polyp was found in the descending colon. The  polyp was sessile. The polyp was removed with a                            cold snare. Resection and retrieval were complete.                           Multiple medium-mouthed diverticula were found in                            the sigmoid colon and ascending colon. One very                            large diverticulum in particular was noted in the                            sigmoid.                           Internal hemorrhoids were found during retroflexion.                           The exam was otherwise without abnormality. Complications:            No immediate complications. Estimated blood loss:                            Minimal. Estimated Blood Loss:     Estimated blood loss was minimal. Impression:               - Two 3 to 4 mm polyps in the sigmoid colon,                            removed with a cold snare. Resected and retrieved.                           - One 3 mm polyp in  the descending colon, removed                            with a cold snare. Resected and retrieved.                           - Diverticulosis in the sigmoid colon and in the                            ascending colon.                           - Internal hemorrhoids.                           - The examination was otherwise normal. Recommendation:           - Patient has a contact number available for                            emergencies. The  signs and symptoms of potential                            delayed complications were discussed with the                            patient. Return to normal activities tomorrow.                            Written discharge instructions were provided to the                            patient.                           - Resume previous diet.                           - Continue present medications.                           - Await pathology results. Remo Lipps P. Armbruster, MD 03/13/2018 4:06:10 PM This report has been signed electronically.

## 2018-03-14 ENCOUNTER — Telehealth: Payer: Self-pay | Admitting: *Deleted

## 2018-03-14 NOTE — Telephone Encounter (Signed)
  Follow up Call-  Call back number 03/13/2018  Post procedure Call Back phone  # [phone]  Permission to leave phone message Yes  Some recent data might be hidden     Patient questions:  Do you have a fever, pain , or abdominal swelling? No. Pain Score  0 *  Have you tolerated food without any problems? Yes.    Have you been able to return to your normal activities? Yes.    Do you have any questions about your discharge instructions: Diet   No. Medications  No. Follow up visit  No.  Do you have questions or concerns about your Care? No.  Actions: * If pain score is 4 or above: No action needed, pain <4.

## 2018-03-17 ENCOUNTER — Encounter: Payer: Self-pay | Admitting: Gastroenterology

## 2018-06-21 ENCOUNTER — Encounter: Payer: Self-pay | Admitting: Physician Assistant

## 2018-06-21 ENCOUNTER — Other Ambulatory Visit (INDEPENDENT_AMBULATORY_CARE_PROVIDER_SITE_OTHER): Payer: Medicare Other

## 2018-06-21 ENCOUNTER — Encounter (INDEPENDENT_AMBULATORY_CARE_PROVIDER_SITE_OTHER): Payer: Self-pay

## 2018-06-21 ENCOUNTER — Ambulatory Visit (INDEPENDENT_AMBULATORY_CARE_PROVIDER_SITE_OTHER): Payer: BLUE CROSS/BLUE SHIELD | Admitting: Physician Assistant

## 2018-06-21 VITALS — BP 116/76 | HR 75 | Ht 69.0 in | Wt 209.4 lb

## 2018-06-21 DIAGNOSIS — R198 Other specified symptoms and signs involving the digestive system and abdomen: Secondary | ICD-10-CM | POA: Diagnosis not present

## 2018-06-21 DIAGNOSIS — N419 Inflammatory disease of prostate, unspecified: Secondary | ICD-10-CM

## 2018-06-21 LAB — URINALYSIS, ROUTINE W REFLEX MICROSCOPIC
Bilirubin Urine: NEGATIVE
Ketones, ur: NEGATIVE
Leukocytes, UA: NEGATIVE
Nitrite: NEGATIVE
RBC / HPF: NONE SEEN (ref 0–?)
Specific Gravity, Urine: >=1.03 — AB (ref 1.000–1.030)
TOTAL PROTEIN, URINE-UPE24: NEGATIVE
Urine Glucose: NEGATIVE
Urobilinogen, UA: 0.2 (ref 0.0–1.0)
WBC, UA: NONE SEEN (ref 0–?)
pH: 5.5 (ref 5.0–8.0)

## 2018-06-21 MED ORDER — SULFAMETHOXAZOLE-TRIMETHOPRIM 800-160 MG PO TABS: 1.0000 | ORAL_TABLET | Freq: Two times a day (BID) | ORAL | 0 refills | Status: AC

## 2018-06-21 NOTE — Patient Instructions (Addendum)
Your provider has requested that you go to the basement level for lab work before leaving today. Press "B" on the elevator. The lab is located at the first door on the left as you exit the elevator.  We sent a prescription to [facility], [place].  1. Sulfamethoxazole/Trimethopium 88/160 mg  We made you an appointment with Dr. Link Snuffer,  On Tues 07-18-2018 at 9:00 am Arrive at 8:45  Am.  You will get paperwork in the mail and an Email about the appointment.     If you are age 58 or younger, your body mass index should be between 19-25. Your Body mass index is 30.92 kg/m. If this is out of the aformentioned range listed, please consider follow up with your Primary Care Provider.

## 2018-06-21 NOTE — Progress Notes (Signed)
Subjective:    Patient ID: Zachary [name], male    DOB: 1960-11-12, 57 y.o.   MRN: [id number]  HPI  Zachary [name] is a pleasant 57 year old male,  originally from [place], and known to Dr. Havery Moros.  Patient was last seen here in May 2019 when he underwent colonoscopy.  Rectal exam was benign at that time, he had 3 polyps removed which were tubular adenomas, and noted to have multiple diverticuli in the sigmoid and descending colon as well as small internal hemorrhoids.  He is indicated for 3-year interval follow-up. Patient comes in today with 1 month history of what he describes as internal rectal pressure worse with sitting for a prolonged period of time.  His bowel movements have been normal, he has no complaints of abdominal pain, no melena or hematochezia.  He says he has been getting some pressure sensation in his bladder and penis with urination.  He feels he is able to evacuate his bladder.  He has not had any hematuria  He has had some increase in urinary frequency, getting up a few times at night over the past month as well.  Review of his records show he has a diagnosis of chronic prostatitis.  Patient tells me that he did see a urologist several years ago and had been given a short course of antibiotics.  He did take some Cipro recently perhaps from an urgent care- says it made him feel dizzy so he took a few doses and stopped it.  Review of Systems Pertinent positive and negative review of systems were noted in the above HPI section.  All other review of systems was otherwise negative.  Outpatient Encounter Medications as of 06/21/2018  Medication Sig  . ciprofloxacin (CIPRO) 500 MG tablet Take 1 tablet by mouth 2 (two) times daily.  . Saw Palmetto, Serenoa repens, (SAW PALMETTO PO) Take 1 tablet by mouth daily.  Marland Kitchen sulfamethoxazole-trimethoprim (BACTRIM DS,SEPTRA DS) 800-160 MG tablet Take 1 tablet by mouth 2 (two) times daily for 7 days.  . [DISCONTINUED] 0.9 %  sodium chloride  infusion    No facility-administered encounter medications on file as of 06/21/2018.    Allergies  Allergen Reactions  . Dust Mite Extract Hives  . Milk-Related Compounds Other (See Comments)    They say I am    Patient Active Problem List   Diagnosis Date Noted  . Proteinuria 09/09/2017  . Chronic low back pain 09/09/2017  . Chronic prostatitis 01/13/2015  . Perineal pain in male 11/11/2014  . Rectal pain   . Cough 06/03/2014  . Benign enlargement of prostate 10/15/2013  . Recurrent UTI 10/15/2013  . Urinary tract infection 10/15/2013  . Pain of left calf 06/27/2013  . Urinary hesitancy 10/19/2012  . Backache 08/16/2012  . History of colonic polyps 08/09/2012  . Multiple lipomas 07/10/2012  . Chronic back pain 01/17/2012  . DVT of lower limb, acute ([place]) 08/19/2011  . Dyslipidemia 07/02/2011   Social History   Socioeconomic History  . Marital status: Married    Spouse name: Not on file  . Number of children: 3  . Years of education: Not on file  . Highest education level: Not on file  Occupational History  . Occupation: Surveyor, quantity  Social Needs  . Financial resource strain: Not on file  . Food insecurity:    Worry: Not on file    Inability: Not on file  . Transportation needs:    Medical: Not on file    Non-medical: Not on  file  Tobacco Use  . Smoking status: Never Smoker  . Smokeless tobacco: Never Used  Substance and Sexual Activity  . Alcohol use: Yes    Comment: occ wine  . Drug use: No  . Sexual activity: Not on file  Lifestyle  . Physical activity:    Days per week: Not on file    Minutes per session: Not on file  . Stress: Not on file  Relationships  . Social connections:    Talks on phone: Not on file    Gets together: Not on file    Attends religious service: Not on file    Active member of club or organization: Not on file    Attends meetings of clubs or organizations: Not on file    Relationship status: Not on file  . Intimate  partner violence:    Fear of current or ex partner: Not on file    Emotionally abused: Not on file    Physically abused: Not on file    Forced sexual activity: Not on file  Other Topics Concern  . Not on file  Social History Narrative   Married with 3 children ages 5, 47 and 36.  Originally from former [place].  On disability 2/2 to his chronic back pain. Denies tobacco use.  Drinks 2-3 glasses of wine per day.  Denies illicit drugs.    Zachary [name] family history includes Heart attack in his mother; Heart disease in his mother; Stroke in his father.      Objective:    Vitals:   06/21/18 1023  BP: 116/76  Pulse: 75    Physical Exam; well-developed white male in no acute distress, pleasant blood pressure 116/76 pulse 75, height 5 foot 9, weight 209, BMI 30.9.  HEENT; nontraumatic normocephalic EOMI PERRLA sclera anicteric oral mucosa moist, Cardiovascular; regular rate and rhythm with S1-S2 no murmur rub or gallop, Pulmonary ;clear bilaterally, Abdomen; soft, bowel sounds are present nontender there is no palpable mass or hepatosplenomegaly, Rectal ;exam no external hemorrhoids noted, digital exam pertinent for definite prostate tenderness with palpation, stool heme-negative. Ext;no clubbing cyanosis or edema skin warm and dry, Neuro psych; alert and oriented, grossly nonfocal mood and affect appropriate       Assessment & Plan:   #69 57 year old white male with 1 month history of rectal pressure, and urinary symptoms with pressure sensation of  the bladder and penis with urination, and frequent nocturnal urination.  I think his symptoms are very consistent with prostatitis, I am not sure about the diagnosis of chronic prostatitis.  He may have BPH and needs further urologic evaluation.  #2 small internal hemorrhoids, asymptomatic 3 diverticulosis prior history of diverticulitis with perforation  #4 history of tubular adenomatous colon polyps-due for follow-up colonoscopy  2022  Plan; Check UA today As patient did not tolerate Cipro well, we will give him a course of sulfamethoxazole/trimethoprim 160 mg p.o. twice daily x7 days. We have also arranged a urology appointment for him at [facility] urology on September 18. Follow-up with Dr. Havery Moros or myself as needed.  Amy S Esterwood PA-C 06/21/2018   Cc: Bonnita Hollow, MD

## 2018-06-22 NOTE — Progress Notes (Signed)
Agree with assessment and plan as outlined.  

## 2018-07-18 DIAGNOSIS — N401 Enlarged prostate with lower urinary tract symptoms: Secondary | ICD-10-CM | POA: Diagnosis not present

## 2018-07-18 DIAGNOSIS — R351 Nocturia: Secondary | ICD-10-CM | POA: Diagnosis not present

## 2018-07-18 DIAGNOSIS — N411 Chronic prostatitis: Secondary | ICD-10-CM | POA: Diagnosis not present

## 2018-09-08 DIAGNOSIS — N401 Enlarged prostate with lower urinary tract symptoms: Secondary | ICD-10-CM | POA: Diagnosis not present

## 2018-09-08 DIAGNOSIS — N411 Chronic prostatitis: Secondary | ICD-10-CM | POA: Diagnosis not present

## 2018-09-08 DIAGNOSIS — R351 Nocturia: Secondary | ICD-10-CM | POA: Diagnosis not present

## 2018-09-13 ENCOUNTER — Other Ambulatory Visit: Payer: Self-pay

## 2018-09-13 ENCOUNTER — Emergency Department (HOSPITAL_BASED_OUTPATIENT_CLINIC_OR_DEPARTMENT_OTHER)
Admission: EM | Admit: 2018-09-13 | Discharge: 2018-09-13 | Disposition: A | Discharge status: Home / Self Care | Payer: BLUE CROSS/BLUE SHIELD | Attending: Emergency Medicine | Admitting: Emergency Medicine

## 2018-09-13 ENCOUNTER — Encounter (HOSPITAL_BASED_OUTPATIENT_CLINIC_OR_DEPARTMENT_OTHER): Payer: Self-pay | Admitting: *Deleted

## 2018-09-13 DIAGNOSIS — Z79899 Other long term (current) drug therapy: Secondary | ICD-10-CM | POA: Insufficient documentation

## 2018-09-13 DIAGNOSIS — R531 Weakness: Secondary | ICD-10-CM | POA: Insufficient documentation

## 2018-09-13 DIAGNOSIS — R42 Dizziness and giddiness: Secondary | ICD-10-CM

## 2018-09-13 DIAGNOSIS — R61 Generalized hyperhidrosis: Secondary | ICD-10-CM | POA: Diagnosis not present

## 2018-09-13 LAB — BASIC METABOLIC PANEL
Anion gap: 9 (ref 5–15)
BUN: 25 mg/dL — AB (ref 6–20)
CALCIUM: 8.9 mg/dL (ref 8.9–10.3)
CO2: 24 mmol/L (ref 22–32)
Chloride: 103 mmol/L (ref 98–111)
Creatinine, Ser: 1.03 mg/dL (ref 0.61–1.24)
GFR calc Af Amer: >60 mL/min (ref >60)
GLUCOSE: 99 mg/dL (ref 70–99)
Potassium: 4.1 mmol/L (ref 3.5–5.1)
SODIUM: 136 mmol/L (ref 135–145)

## 2018-09-13 LAB — CBC WITH DIFFERENTIAL/PLATELET
ABS IMMATURE GRANULOCYTES: 0.03 10*3/uL (ref 0.00–0.07)
BASOS PCT: 1 %
Basophils Absolute: 0.1 10*3/uL (ref 0.0–0.1)
EOS ABS: 0.2 10*3/uL (ref 0.0–0.5)
Eosinophils Relative: 4 %
HCT: 45.4 % (ref 39.0–52.0)
Hemoglobin: 14.9 g/dL (ref 13.0–17.0)
Immature Granulocytes: 1 %
Lymphocytes Relative: 36 %
Lymphs Abs: 2.1 10*3/uL (ref 0.7–4.0)
MCH: 30 pg (ref 26.0–34.0)
MCHC: 32.8 g/dL (ref 30.0–36.0)
MCV: 91.3 fL (ref 80.0–100.0)
MONOS PCT: 10 %
Monocytes Absolute: 0.6 10*3/uL (ref 0.1–1.0)
NEUTROS PCT: 48 %
Neutro Abs: 2.9 10*3/uL (ref 1.7–7.7)
PLATELETS: 240 10*3/uL (ref 150–400)
RBC: 4.97 MIL/uL (ref 4.22–5.81)
RDW: 13 % (ref 11.5–15.5)
WBC: 5.9 10*3/uL (ref 4.0–10.5)
nRBC: 0 % (ref 0.0–0.2)

## 2018-09-13 NOTE — Discharge Instructions (Addendum)
You have been seen today for dizziness. Please read and follow all provided instructions.   1. Medications: usual home medications 2. Treatment: rest, drink plenty of fluids, avoid driving until follow up appointment with PCP. 3. Follow Up: Please follow up with your primary doctor in 2 days for discussion of your diagnoses and further evaluation after today's visit; if you do not have a primary care doctor use the resource guide provided to find one; Please return to the ER for any new or worsening symptoms.   Take medications as prescribed. Return to the emergency room for worsening condition or new concerning symptoms. Follow up with your regular doctor. If you don't have a regular doctor use one of the numbers below to establish a primary care doctor.   Emergency Department Resource Guide 1) Find a Doctor and Pay Out of Pocket Although you won't have to find out who is covered by your insurance plan, it is a good idea to ask around and get recommendations. You will then need to call the office and see if the doctor you have chosen will accept you as a new patient and what types of options they offer for patients who are self-pay. Some doctors offer discounts or will set up payment plans for their patients who do not have insurance, but you will need to ask so you aren't surprised when you get to your appointment.  2) Contact Your Local Health Department Not all health departments have doctors that can see patients for sick visits, but many do, so it is worth a call to see if yours does. If you don't know where your local health department is, you can check in your phone book. The CDC also has a tool to help you locate your state's health department, and many state websites also have listings of all of their local health departments.  3) Find a [facility] If your illness is not likely to be very severe or complicated, you may want to try a walk in clinic. These are popping up all over the  country in pharmacies, drugstores, and shopping centers. They're usually staffed by nurse practitioners or physician assistants that have been trained to treat common illnesses and complaints. They're usually fairly quick and inexpensive. However, if you have serious medical issues or chronic medical problems, these are probably not your best option.  No Primary Care Doctor: Call Health Connect at  [phone] - they can help you locate a primary care doctor that  accepts your insurance, provides certain services, etc. Physician Referral [phone]  Emergency Department Resource Guide 1) Find a Doctor and Pay Out of Pocket Although you won't have to find out who is covered by your insurance plan, it is a good idea to ask around and get recommendations. You will then need to call the office and see if the doctor you have chosen will accept you as a new patient and what types of options they offer for patients who are self-pay. Some doctors offer discounts or will set up payment plans for their patients who do not have insurance, but you will need to ask so you aren't surprised when you get to your appointment.  2) Contact Your Local Health Department Not all health departments have doctors that can see patients for sick visits, but many do, so it is worth a call to see if yours does. If you don't know where your local health department is, you can check in your phone book. The CDC also  has a tool to help you locate your state's health department, and many state websites also have listings of all of their local health departments.  3) Find a [facility] If your illness is not likely to be very severe or complicated, you may want to try a walk in clinic. These are popping up all over the country in pharmacies, drugstores, and shopping centers. They're usually staffed by nurse practitioners or physician assistants that have been trained to treat common illnesses and complaints. They're usually  fairly quick and inexpensive. However, if you have serious medical issues or chronic medical problems, these are probably not your best option.  No Primary Care Doctor: Call Health Connect at  [phone] - they can help you locate a primary care doctor that  accepts your insurance, provides certain services, etc. Physician Referral Service- [phone]  Chronic Pain Problems: Organization         Address  Phone   Notes  [facility]  [phone] Patients need to be referred by their primary care doctor.   Medication Assistance: Organization         Address  Phone   Notes  [facility] Medication [facility] [address] [place], [place] [address] [phone] --Must be a resident of [facility] -- Must have NO insurance coverage whatsoever (no Medicaid/ Medicare, etc.) -- The pt. MUST have a primary care doctor that directs their care regularly and follows them in the community   [facility]  [phone]   [employer]  [phone]    Agencies that provide inexpensive medical care: Organization         Address  Phone   Notes  [place]  [phone]   [facility]    [phone]   [facility] [place], [place] [address] [phone]   [place] [address] [place] [phone]   Planned Parenthood    [phone]   [facility]    [phone]   [place] and [place] [address], [place] Phone:  [phone], Fax:  [phone] Hours of Operation:  9 am - 6 pm, M-F.  Also accepts Medicaid/Medicare and self-pay.  [facility] for [place] [address], [address], [place] Phone: [phone], Fax: [phone]. Hours of Operation:  8:30 am - 5:30 pm, M-F.  Also accepts Medicaid and self-pay.  [facility] [address], [place] Phone: [phone]   [place], [place], [place] [phone], Ext. [phone] Mondays & Thursdays: 7-9 AM.  First 15 patients are seen on a first come, first serve basis.    [place] Providers:  Organization         Address  Phone   Notes  [facility] [address], [place] [phone] Also accepts self-pay patients.  [facility] [address], [place]  [phone]   [place], [address], [place] [phone]   [facility] [address] [place] [phone]   Lucianne Lei [address], [place]   [phone] Only accepts [place] patients after they have their name applied to their card.   Self-Pay (no insurance) in [place]:  Organization         Address  Phone   Notes  Sickle Cell Patients, [facility] [place] [phone]   [facility] [place] [phone]   [facility] [place]  [place], [address],  [phone]   [facility]/Dr. Osei-Bonsu  [address] [place] or [address], [place] [phone] Phone number for both [place] and [place] locations is the same.  [facility] [address] [place] [phone]   [facility] [address], [place] or [address] [phone]   [facility] [address] [place] [phone], phone; [phone], fax Sees patients 1st and 3rd Saturday of every month.  Must not qualify for public or private insurance (i.e. Medicaid, Medicare, [place] Health Choice, Veterans' Benefits)  Household income should be no more than 200% of the poverty level The clinic cannot treat you if  you are pregnant or think you are pregnant  Sexually transmitted diseases are not treated at the clinic.

## 2018-09-13 NOTE — ED Notes (Signed)
NAD at this time. Pt is stable and going home.  

## 2018-09-13 NOTE — ED Triage Notes (Signed)
Pt reports episode of acute dizziness and sweating this am around 0950. Denies chest pain. States he felt very weak, like he was going to pass out. Checked his blood pressure and it was 170/110. Took one 81mg   ASA pta. States sx have resolved at this time
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
## 2022-03-30 ENCOUNTER — Encounter: Payer: Self-pay | Admitting: *Deleted

## 2024-04-04 ENCOUNTER — Other Ambulatory Visit: Payer: Self-pay

## 2024-04-04 ENCOUNTER — Emergency Department (HOSPITAL_BASED_OUTPATIENT_CLINIC_OR_DEPARTMENT_OTHER)
Admission: EM | Admit: 2024-04-04 | Discharge: 2024-04-04 | Disposition: A | Discharge status: Home / Self Care | Attending: Emergency Medicine | Admitting: Emergency Medicine

## 2024-04-04 ENCOUNTER — Emergency Department (HOSPITAL_BASED_OUTPATIENT_CLINIC_OR_DEPARTMENT_OTHER)

## 2024-04-04 ENCOUNTER — Encounter (HOSPITAL_BASED_OUTPATIENT_CLINIC_OR_DEPARTMENT_OTHER): Payer: Self-pay

## 2024-04-04 DIAGNOSIS — S39012A Strain of muscle, fascia and tendon of lower back, initial encounter: Secondary | ICD-10-CM | POA: Diagnosis not present

## 2024-04-04 DIAGNOSIS — S161XXA Strain of muscle, fascia and tendon at neck level, initial encounter: Secondary | ICD-10-CM | POA: Diagnosis not present

## 2024-04-04 DIAGNOSIS — Z7901 Long term (current) use of anticoagulants: Secondary | ICD-10-CM | POA: Diagnosis not present

## 2024-04-04 DIAGNOSIS — S199XXA Unspecified injury of neck, initial encounter: Secondary | ICD-10-CM | POA: Diagnosis present

## 2024-04-04 DIAGNOSIS — Y92410 Unspecified street and highway as the place of occurrence of the external cause: Secondary | ICD-10-CM | POA: Diagnosis not present

## 2024-04-04 MED ORDER — LIDOCAINE 5 % EX PTCH: 1.0000 | MEDICATED_PATCH | CUTANEOUS | 0 refills | Status: AC

## 2024-04-04 MED ORDER — ACETAMINOPHEN 500 MG PO TABS
1000.0000 mg | ORAL_TABLET | Freq: Once | ORAL | Status: AC
  Administered 2024-04-04: 1000 mg via ORAL
  Filled 2024-04-04: qty 2

## 2024-04-04 MED ORDER — CYCLOBENZAPRINE HCL 10 MG PO TABS
10.0000 mg | ORAL_TABLET | Freq: Two times a day (BID) | ORAL | 0 refills | Status: AC | PRN
Start: 2024-04-04 — End: ?
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
